# Patient Record
Sex: Female | Born: 1990 | Race: White | Hispanic: No | Marital: Single | Smoking: Current every day smoker
Health system: Southern US, Community
[De-identification: ages and names within clinical notes are randomized; demographics above are authoritative.]

## PROBLEM LIST (undated history)

## (undated) ENCOUNTER — Inpatient Hospital Stay: Payer: Self-pay

## (undated) DIAGNOSIS — Z8719 Personal history of other diseases of the digestive system: Secondary | ICD-10-CM

## (undated) HISTORY — PX: NO PAST SURGERIES: SHX2092

## (undated) HISTORY — PX: WISDOM TOOTH EXTRACTION: SHX21

---
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
## 2010-04-27 ENCOUNTER — Emergency Department: Payer: Self-pay | Admitting: Emergency Medicine

## 2011-03-22 ENCOUNTER — Ambulatory Visit: Payer: Self-pay | Admitting: Internal Medicine

## 2012-01-22 ENCOUNTER — Emergency Department: Payer: Self-pay | Admitting: Unknown Physician Specialty

## 2012-01-22 LAB — PREGNANCY, URINE: Pregnancy Test, Urine: NEGATIVE m[IU]/mL

## 2012-05-03 ENCOUNTER — Emergency Department: Payer: Self-pay | Admitting: Emergency Medicine

## 2013-03-18 ENCOUNTER — Emergency Department: Payer: Self-pay | Admitting: Emergency Medicine

## 2013-09-15 ENCOUNTER — Encounter (HOSPITAL_COMMUNITY): Payer: Self-pay | Admitting: Emergency Medicine

## 2013-09-15 ENCOUNTER — Emergency Department (HOSPITAL_COMMUNITY): Payer: Self-pay

## 2013-09-15 ENCOUNTER — Emergency Department (HOSPITAL_COMMUNITY)
Admission: EM | Admit: 2013-09-15 | Discharge: 2013-09-15 | Disposition: A | Discharge status: Home / Self Care | Payer: Self-pay | Attending: Emergency Medicine | Admitting: Emergency Medicine

## 2013-09-15 DIAGNOSIS — B9689 Other specified bacterial agents as the cause of diseases classified elsewhere: Secondary | ICD-10-CM | POA: Insufficient documentation

## 2013-09-15 DIAGNOSIS — D72829 Elevated white blood cell count, unspecified: Secondary | ICD-10-CM | POA: Insufficient documentation

## 2013-09-15 DIAGNOSIS — M545 Low back pain, unspecified: Secondary | ICD-10-CM | POA: Insufficient documentation

## 2013-09-15 DIAGNOSIS — J189 Pneumonia, unspecified organism: Secondary | ICD-10-CM

## 2013-09-15 DIAGNOSIS — J159 Unspecified bacterial pneumonia: Secondary | ICD-10-CM | POA: Insufficient documentation

## 2013-09-15 DIAGNOSIS — Z3202 Encounter for pregnancy test, result negative: Secondary | ICD-10-CM | POA: Insufficient documentation

## 2013-09-15 DIAGNOSIS — A499 Bacterial infection, unspecified: Secondary | ICD-10-CM | POA: Insufficient documentation

## 2013-09-15 DIAGNOSIS — N760 Acute vaginitis: Secondary | ICD-10-CM | POA: Insufficient documentation

## 2013-09-15 DIAGNOSIS — R112 Nausea with vomiting, unspecified: Secondary | ICD-10-CM | POA: Insufficient documentation

## 2013-09-15 LAB — COMPREHENSIVE METABOLIC PANEL
ALBUMIN: 4.1 g/dL (ref 3.5–5.2)
ALK PHOS: 86 U/L (ref 39–117)
ALT: 8 U/L (ref 0–35)
AST: 28 U/L (ref 0–37)
BUN: 8 mg/dL (ref 6–23)
CALCIUM: 9.7 mg/dL (ref 8.4–10.5)
CO2: 20 mEq/L (ref 19–32)
Chloride: 98 mEq/L (ref 96–112)
Creatinine, Ser: 0.72 mg/dL (ref 0.50–1.10)
GFR calc non Af Amer: >90 mL/min (ref >90)
Glucose, Bld: 92 mg/dL (ref 70–99)
POTASSIUM: 3.9 meq/L (ref 3.7–5.3)
SODIUM: 136 meq/L — AB (ref 137–147)
TOTAL PROTEIN: 8.1 g/dL (ref 6.0–8.3)
Total Bilirubin: 1.1 mg/dL (ref 0.3–1.2)

## 2013-09-15 LAB — URINALYSIS, ROUTINE W REFLEX MICROSCOPIC
GLUCOSE, UA: NEGATIVE mg/dL
Hgb urine dipstick: NEGATIVE
KETONES UR: 40 mg/dL — AB
Nitrite: NEGATIVE
PROTEIN: NEGATIVE mg/dL
Specific Gravity, Urine: 1.021 (ref 1.005–1.030)
Urobilinogen, UA: 1 mg/dL (ref 0.0–1.0)
pH: 6 (ref 5.0–8.0)

## 2013-09-15 LAB — CBC WITH DIFFERENTIAL/PLATELET
BASOS ABS: 0 10*3/uL (ref 0.0–0.1)
Basophils Relative: 0 % (ref 0–1)
EOS ABS: 0 10*3/uL (ref 0.0–0.7)
Eosinophils Relative: 0 % (ref 0–5)
HCT: 43.5 % (ref 36.0–46.0)
HEMOGLOBIN: 15.2 g/dL — AB (ref 12.0–15.0)
Lymphocytes Relative: 4 % — ABNORMAL LOW (ref 12–46)
Lymphs Abs: 1.6 10*3/uL (ref 0.7–4.0)
MCH: 30.2 pg (ref 26.0–34.0)
MCHC: 34.9 g/dL (ref 30.0–36.0)
MCV: 86.3 fL (ref 78.0–100.0)
Monocytes Absolute: 2.3 10*3/uL — ABNORMAL HIGH (ref 0.1–1.0)
Monocytes Relative: 6 % (ref 3–12)
NEUTROS PCT: 90 % — AB (ref 43–77)
Neutro Abs: 34.9 10*3/uL — ABNORMAL HIGH (ref 1.7–7.7)
Platelets: 237 10*3/uL (ref 150–400)
RBC: 5.04 MIL/uL (ref 3.87–5.11)
RDW: 13.2 % (ref 11.5–15.5)
WBC: 38.8 10*3/uL — ABNORMAL HIGH (ref 4.0–10.5)

## 2013-09-15 LAB — LIPASE, BLOOD: Lipase: 17 U/L (ref 11–59)

## 2013-09-15 LAB — WET PREP, GENITAL
Trich, Wet Prep: NONE SEEN
YEAST WET PREP: NONE SEEN

## 2013-09-15 LAB — URINE MICROSCOPIC-ADD ON

## 2013-09-15 LAB — PREGNANCY, URINE: PREG TEST UR: NEGATIVE

## 2013-09-15 MED ORDER — ONDANSETRON HCL 4 MG/2ML IJ SOLN
4.0000 mg | Freq: Once | INTRAMUSCULAR | Status: AC
  Administered 2013-09-15: 4 mg via INTRAVENOUS
  Filled 2013-09-15: qty 2

## 2013-09-15 MED ORDER — ONDANSETRON HCL 4 MG PO TABS: 4.0000 mg | ORAL_TABLET | Freq: Four times a day (QID) | ORAL | Status: DC

## 2013-09-15 MED ORDER — AZITHROMYCIN 250 MG PO TABS: 250.0000 mg | ORAL_TABLET | Freq: Every day | ORAL | Status: DC

## 2013-09-15 MED ORDER — DEXTROSE 5 % IV SOLN
1.0000 g | Freq: Once | INTRAVENOUS | Status: AC
  Administered 2013-09-15: 1 g via INTRAVENOUS
  Filled 2013-09-15: qty 10

## 2013-09-15 MED ORDER — IOHEXOL 300 MG/ML  SOLN
100.0000 mL | Freq: Once | INTRAMUSCULAR | Status: AC | PRN
  Administered 2013-09-15: 100 mL via INTRAVENOUS

## 2013-09-15 MED ORDER — METRONIDAZOLE 500 MG PO TABS: 500.0000 mg | ORAL_TABLET | Freq: Two times a day (BID) | ORAL | Status: DC

## 2013-09-15 MED ORDER — MORPHINE SULFATE 4 MG/ML IJ SOLN
4.0000 mg | Freq: Once | INTRAMUSCULAR | Status: AC
  Administered 2013-09-15: 4 mg via INTRAVENOUS
  Filled 2013-09-15: qty 1

## 2013-09-15 MED ORDER — SODIUM CHLORIDE 0.9 % IV BOLUS (SEPSIS)
1000.0000 mL | Freq: Once | INTRAVENOUS | Status: AC
  Administered 2013-09-15: 1000 mL via INTRAVENOUS

## 2013-09-15 NOTE — Discharge Instructions (Signed)
Please call and set-up an appointment with [facility] first thing Monday morning to be re-assessed Please rest and stay hydrated Please take antibiotics as prescribed - Azithromycin for pneumonia, Flagyl for bacterial vaginosis Please take zofran as needed for nausea Please drink plenty of fluids Please avoid foods high in fat and grease Please avoid any sexual activity until medications for BV infection have been completed Please continue to monitor symptoms and if symptoms are to worsen or change (fever greater than 101, chills, sweating, chest pain, shortness of breath, difficulty breathing, abdominal pain worsens or changes, coughing up of blood, neck pain, neck stiffness, blood in the stools, black tarry stools) please report back to the ED  Pneumonia, Adult Pneumonia is an infection of the lungs.  CAUSES Pneumonia may be caused by bacteria or a virus. Usually, these infections are caused by breathing infectious particles into the lungs (respiratory tract). SYMPTOMS   Cough.  Fever.  Chest pain.  Increased rate of breathing.  Wheezing.  Mucus production. DIAGNOSIS  If you have the common symptoms of pneumonia, your caregiver will typically confirm the diagnosis with a chest X-ray. The X-ray will show an abnormality in the lung (pulmonary infiltrate) if you have pneumonia. Other tests of your blood, urine, or sputum may be done to find the specific cause of your pneumonia. Your caregiver may also do tests (blood gases or pulse oximetry) to see how well your lungs are working. TREATMENT  Some forms of pneumonia may be spread to other people when you cough or sneeze. You may be asked to wear a mask before and during your exam. Pneumonia that is caused by bacteria is treated with antibiotic medicine. Pneumonia that is caused by the influenza virus may be treated with an antiviral medicine. Most other viral infections must run their course. These infections will not  respond to antibiotics.  PREVENTION A pneumococcal shot (vaccine) is available to prevent a common bacterial cause of pneumonia. This is usually suggested for:  People over 450 years old.  Patients on chemotherapy.  People with chronic lung problems, such as bronchitis or emphysema.  People with immune system problems. If you are over 65 or have a high risk condition, you may receive the pneumococcal vaccine if you have not received it before. In some countries, a routine influenza vaccine is also recommended. This vaccine can help prevent some cases of pneumonia.You may be offered the influenza vaccine as part of your care. If you smoke, it is time to quit. You may receive instructions on how to stop smoking. Your caregiver can provide medicines and counseling to help you quit. HOME CARE INSTRUCTIONS   Cough suppressants may be used if you are losing too much rest. However, coughing protects you by clearing your lungs. You should avoid using cough suppressants if you can.  Your caregiver may have prescribed medicine if he or she thinks your pneumonia is caused by a bacteria or influenza. Finish your medicine even if you start to feel better.  Your caregiver may also prescribe an expectorant. This loosens the mucus to be coughed up.  Only take over-the-counter or prescription medicines for pain, discomfort, or fever as directed by your caregiver.  Do not smoke. Smoking is a common cause of bronchitis and can contribute to pneumonia. If you are a smoker and continue to smoke, your cough may last several weeks after your pneumonia has cleared.  A cold steam vaporizer or humidifier in your room or home may help loosen  mucus.  Coughing is often worse at night. Sleeping in a semi-upright position in a recliner or using a couple pillows under your head will help with this.  Get rest as you feel it is needed. Your body will usually let you know when you need to rest. SEEK IMMEDIATE MEDICAL  CARE IF:   Your illness becomes worse. This is especially true if you are elderly or weakened from any other disease.  You cannot control your cough with suppressants and are losing sleep.  You begin coughing up blood.  You develop pain which is getting worse or is uncontrolled with medicines.  You have a fever.  Any of the symptoms which initially brought you in for treatment are getting worse rather than better.  You develop shortness of breath or chest pain. MAKE SURE YOU:   Understand these instructions.  Will watch your condition.  Will get help right away if you are not doing well or get worse. Document Released: 06/28/2005 Document Revised: 09/20/2011 Document Reviewed: 09/17/2010 [facility] Patient Information 2014 [place], [place].  Bacterial Vaginosis Bacterial vaginosis is a vaginal infection that occurs when the normal balance of bacteria in the vagina is disrupted. It results from an overgrowth of certain bacteria. This is the most common vaginal infection in women of childbearing age. Treatment is important to prevent complications, especially in pregnant women, as it can cause a premature delivery. CAUSES  Bacterial vaginosis is caused by an increase in harmful bacteria that are normally present in smaller amounts in the vagina. Several different kinds of bacteria can cause bacterial vaginosis. However, the reason that the condition develops is not fully understood. RISK FACTORS Certain activities or behaviors can put you at an increased risk of developing bacterial vaginosis, including:  Having a new sex partner or multiple sex partners.  Douching.  Using an intrauterine device (IUD) for contraception. Women do not get bacterial vaginosis from toilet seats, bedding, swimming pools, or contact with objects around them. SIGNS AND SYMPTOMS  Some women with bacterial vaginosis have no signs or symptoms. Common symptoms include:  Grey vaginal discharge.  A fishlike  odor with discharge, especially after sexual intercourse.  Itching or burning of the vagina and vulva.  Burning or pain with urination. DIAGNOSIS  Your health care provider will take a medical history and examine the vagina for signs of bacterial vaginosis. A sample of vaginal fluid may be taken. Your health care provider will look at this sample under a microscope to check for bacteria and abnormal cells. A vaginal pH test may also be done.  TREATMENT  Bacterial vaginosis may be treated with antibiotic medicines. These may be given in the form of a pill or a vaginal cream. A second round of antibiotics may be prescribed if the condition comes back after treatment.  HOME CARE INSTRUCTIONS   Only take over-the-counter or prescription medicines as directed by your health care provider.  If antibiotic medicine was prescribed, take it as directed. Make sure you finish it even if you start to feel better.  Do not have sex until treatment is completed.  Tell all sexual partners that you have a vaginal infection. They should see their health care provider and be treated if they have problems, such as a mild rash or itching.  Practice safe sex by using condoms and only having one sex partner. SEEK MEDICAL CARE IF:   Your symptoms are not improving after 3 days of treatment.  You have increased discharge or pain.  You  have a fever. MAKE SURE YOU:   Understand these instructions.  Will watch your condition.  Will get help right away if you are not doing well or get worse. FOR MORE INFORMATION  Centers for Disease Control and Prevention, Division of STD Prevention: SolutionApps.co.za American Sexual Health Association (ASHA): www.ashastd.org  Document Released: 06/28/2005 Document Revised: 04/18/2013 Document Reviewed: 02/07/2013 [facility] Patient Information 2014 [place], [place].   Emergency Department Resource Guide 1) Find a Doctor and Pay Out of Pocket Although you won't have to find  out who is covered by your insurance plan, it is a good idea to ask around and get recommendations. You will then need to call the office and see if the doctor you have chosen will accept you as a new patient and what types of options they offer for patients who are self-pay. Some doctors offer discounts or will set up payment plans for their patients who do not have insurance, but you will need to ask so you aren't surprised when you get to your appointment.  2) Contact Your Local Health Department Not all health departments have doctors that can see patients for sick visits, but many do, so it is worth a call to see if yours does. If you don't know where your local health department is, you can check in your phone book. The CDC also has a tool to help you locate your state's health department, and many state websites also have listings of all of their local health departments.  3) Find a Walk-in Clinic If your illness is not likely to be very severe or complicated, you may want to try a walk in clinic. These are popping up all over the country in pharmacies, drugstores, and shopping centers. They're usually staffed by nurse practitioners or physician assistants that have been trained to treat common illnesses and complaints. They're usually fairly quick and inexpensive. However, if you have serious medical issues or chronic medical problems, these are probably not your best option.  No Primary Care Doctor: - Call Health Connect at  [phone] - they can help you locate a primary care doctor that  accepts your insurance, provides certain services, etc. - Physician Referral Service- [phone]  Chronic Pain Problems: Organization         Address  Phone   Notes  [facility]  [phone] Patients need to be referred by their primary care doctor.   Medication Assistance: Organization         Address  Phone   Notes  [facility] Medication [facility] [address]  [place] [place], [place] [address] [phone] --Must be a resident of [facility] -- Must have NO insurance coverage whatsoever (no Medicaid/ Medicare, etc.) -- The pt. MUST have a primary care doctor that directs their care regularly and follows them in the community   [facility]  [phone]   [employer]  [phone]    Agencies that provide inexpensive medical care: Organization         Address  Phone   Notes  [facility]  [phone]   [facility]    [phone]   [facility] [address] [place], [place] [address] [phone]   [facility] [address] [place], [place] [phone]   Planned Parenthood    [phone]   [facility]    [phone]   [facility]  [address], [place] Phone:  [phone], Fax:  [phone] Hours of Operation:  9 am - 6 pm, M-F.  Also accepts Medicaid/Medicare and self-pay.  °[facility] ° [address], [place] Phone: [phone], Fax: [phone]. Hours of Operation:  8:30 am - 5:30 pm, M-F.  Also accepts Medicaid and self-pay.  °[facility] [place] [address], [place] Phone: ([phone]) [phone]   °[facility] [address], [place], [place] (336)[phone], Ext. 123 Mondays & Thursdays: 7-9 AM.  First 15 patients are seen on a first come, first serve basis. °  ° °Medicaid-accepting [place] Providers: ° °Organization         Address  Phone   Notes  °[facility] [address], [place] ([phone]) [phone] Also accepts self-pay patients.  °[facility] [address], [place] ° ([phone]) [phone]   °[facility] [address], [place] ([phone]) [phone]   °[facility] [address] [place] [address], [place] (336) [phone]   °Veita Bland  [address], [place]  ° ([phone]) [phone] Only accepts [place] Access Medicaid patients after they have their name applied to their card.  ° °Self-Pay (no insurance) in [place]: ° °Organization         Address  Phone   Notes  °Sickle Cell Patients, [facility] [address], [place] ([phone]) [phone]   °[facility] [address], [place] (336) [phone]   ° [facility] ° [address], [place] ([phone]) [phone]   °[facility]/Dr. Osei-Bonsu ° [address] [place] [address], [place] or [address], [place] ([phone]) [phone] Phone number for both [place] and [place] locations is the same.  °[facility] [address], [place] ([phone]) [phone]   °[facility] [address] [place] [address], [place] or [address] (336) [phone] °(336) [phone]   °[facility] [address],  (336) [phone], phone; ([phone]) [phone], fax Sees patients 1st and 3rd Saturday of every month.  Must not qualify for public or private insurance (i.e. Medicaid, Medicare, [place] Health Choice, Veterans' Benefits) • Household income should be no more than 200% of the poverty level •The clinic cannot treat you if you are pregnant or think you are pregnant • Sexually transmitted diseases are not treated at the clinic.  ° ° °Dental Care: °Organization         Address  Phone  Notes  °[place] [facility] [address],  (336) [phone] Accepts children up to age 21 who are enrolled in Medicaid or [place] Health Choice; pregnant women with a Medicaid card; and children who have applied for Medicaid or [place] Health Choice, but were declined, whose parents can pay a reduced fee at time of service.  °[place] [facility] [place]  [address], [place] (336) [phone] Accepts children up to age 21  who are enrolled in Medicaid or [place] Health Choice; pregnant women with a Medicaid card; and children who have applied for Medicaid or [place] Health Choice, but were declined, whose parents can pay a reduced fee at time of service.  °[facility] ° [address],  ([phone]) [phone]   Patients are seen by appointment only. Walk-ins are not accepted. [facility] will see patients 18 years of age and older. °Monday - Tuesday (8am-5pm) °Most Wednesdays (8:30-5pm) °$30 per visit, cash only  °[facility] ° [address], [place] ([phone]) [phone] Patients are seen by appointment only. Walk-ins are not accepted. [facility] will see patients 18 years of age and older. °One Wednesday Evening (Monthly: Volunteer Based).  $30 per visit, cash only  °[facility]  ([phone]) [phone] for adults; Children under age 4, call Graduate Pediatric Dentistry at [phone]. Children aged 4-14, please call [phone] to request a pediatric application. ° Dental services are provided in all areas of dental care including fillings, crowns and bridges, complete and partial dentures, implants, gum treatment, root canals, and extractions. Preventive care is also provided. Treatment is provided to both adults and children. °Patients are selected via a lottery and there is often a waiting list. °  °[facility] [address], °[place] ° (336) 763-8833 www.drcivils.com °  °[facility] [address], [place], [place] (336)723-1848, Ext. 123 Second and Fourth Thursday of each month, opens at 6:30 AM; Clinic ends at 9 AM.  Patients are seen on a first-come first-served basis, and a limited number are seen during each clinic.  ° °[facility] ° [address], [place], [place] (336) [phone]   Eligibility Requirements °You must have lived in [place], [place], or [place] counties for at least the last three months. °   You cannot be eligible for state or federal sponsored healthcare insurance, including Veterans Administration, Medicaid, or Medicare. °  You generally cannot be eligible for healthcare insurance through your employer.  °  How to apply: °Eligibility screenings are held every Tuesday and Wednesday afternoon from 1:00 pm until 4:00 pm. You do not need an appointment for the interview!  °[facility] [address], [place], [place] 336-631-2330   °[place] [facility]  336-342-8273   °[facility]  336-703-3100   °[facility]  336-570-6415   ° °Behavioral Health Resources in the Community: °Intensive Outpatient Programs °Organization         Address  Phone  Notes  °[facility] [address], [place], [place] 336-878-6098   °[facility] [address], [place], [place] 336-832-9800   °[facility] [address], [place], [place] ° 336-882-2125   °[facility] [address],  °[place], [place] 1-800-853-5163 or 336-641-4981   °Substance Abuse Resources °Organization         Address  Phone  Notes  °[facility]  336-882-2125   °[facility]  336-784-9470   °[facility]  336-285-9073   °[facility]  336-845-3988   °Residential & Outpatient Substance Abuse Program  1-800-659-3381   °Psychological Services °Organization         Address  Phone  Notes  ° Health  336- 832-9600   °[facility]  336- 378-7881   °[facility] [address], [place] 1-800-853-5163 or 336-641-4981   ° °Mobile Crisis Teams °Organization         Address  Phone  Notes  °[facility], Mobile Crisis Care Unit  1-877-626-1772   °Assertive °[facility] ° [address] [place], [place] 336-834-9664   °Sharon DeEsch [address] °  [place] 336-554-5454   ° °Self-Help/Support  Groups °Organization         Address  Phone             Notes  °[facility] [place] - variety of support groups  336- 373-1402 Call for more information  °Narcotics Anonymous (NA), [facility] [address], °[place]  2 meetings at this location  ° °Residential Treatment Programs °Organization         Address  Phone  Notes  °[facility] [address],    °[place]  1-866-801-8205   °[facility] ° [address], [place], [place] 704-293-8524   °[facility] [address], [place] 336-845-3988 Admissions: 8am-3pm M-F  °[facility] [address]    °[place], [place] 336-841-1104   °[facility] [address], [place], [place] 336-379-7146   °[facility] [address]  °[place], [place] 336-285-9073   °[facility] - Intensive Outpatient [address], [place], [place] 336-852-3033   °[facility] [address]  °[place], [place] 1-877-615-2722 or 336-784-9470   °[facility] ([facility]) [address] [place], [place] 336-227-7417 Accepts Medicaid  °[facility] [address]  °[place] 1-800-659-3381 Substance Abuse/Addiction Treatment  ° °[place] Behavioral Health Resources °Organization         Address  Phone  Notes  °[facility]  (888) 581-9988   °Julie Brannon, PhD [address] [place], [place]   (336) 349-5553 or (336) 951-0000   °[facility]   [address] °[place], [place] (336) 349-4454   °[facility] [address], [place], [place] (336) 342-8316 Insurance/Medicaid/sponsorship through [facility]  °[facility] [address]                                    [place], [place] (336) 342-8316 Therapy/tele-psych/case  °[facility] [address]  ° [place], [place] (336) 349-2233    °Dr. Arfeen  (336) 349-4544   °[facility] [place]  [employer] [place] [facility] 1) [address], [place] °2) [address], [place] °3)  [address], [place] (336) 349-3220 °(336) 342-7768 ° °(336) 342-8140   °[place] Child Abuse [facility] (336) 342-1394 or (336) 342-3537 (After Hours)    ° ° ° °

## 2013-09-15 NOTE — ED Provider Notes (Signed)
Medical screening examination/treatment/procedure(s) were conducted as a shared visit with non-physician practitioner(s) and myself.  I personally evaluated the patient during the encounter.  Patient with normal VS, main complaint of nausea and vomiting, vague and diffuse back pain. Abdominal, back, pelvic exams are benign. However, patient found to have WBC of 38,000.  I have discussed my concerns for occult infection - possibly retroperitoneal process v. Atypical appendicits or ruptured appendicitis, with the patient and we will obtain a contrasted CT scan of the a/p for further evaluation.   I have counseled the patient on the importance of follow up this coming [facility] for further evaluation of leukocytosis. She voices understanding. The patient will be signed out to Dr. Fredderick PhenixBelfi at change of shift and she will follow up on the CT scan and disposition appropriately.   Brandt LoosenJulie Leighanne Adolph, MD 09/15/13 [phone]

## 2013-09-15 NOTE — ED Notes (Signed)
Per EMS, Pt coming from home c/o N/V, productive cough and chills x 3 days. A&Ox4.

## 2013-09-15 NOTE — ED Notes (Signed)
Family at bedside. 

## 2013-09-15 NOTE — ED Provider Notes (Signed)
Discussed case with Jaynie Crumble, PA-C. Transfer of care to this provider from [facility], PA-C at change in shift.   Lauren Carrillo is a 23 y/o F with no known significant PMHx presenting to the ED with nausea, vomiting, productive cough, chills for 3 days.   Results for orders placed during the hospital encounter of 09/15/13  WET PREP, GENITAL      Result Value Ref Range   Yeast Wet Prep HPF POC NONE SEEN  NONE SEEN   Trich, Wet Prep NONE SEEN  NONE SEEN   Clue Cells Wet Prep HPF POC FEW (*) NONE SEEN   WBC, Wet Prep HPF POC FEW (*) NONE SEEN  URINALYSIS, ROUTINE W REFLEX MICROSCOPIC      Result Value Ref Range   Color, Urine AMBER (*) YELLOW   APPearance CLOUDY (*) CLEAR   Specific Gravity, Urine 1.021  1.005 - 1.030   pH 6.0  5.0 - 8.0   Glucose, UA NEGATIVE  NEGATIVE mg/dL   Hgb urine dipstick NEGATIVE  NEGATIVE   Bilirubin Urine SMALL (*) NEGATIVE   Ketones, ur 40 (*) NEGATIVE mg/dL   Protein, ur NEGATIVE  NEGATIVE mg/dL   Urobilinogen, UA 1.0  0.0 - 1.0 mg/dL   Nitrite NEGATIVE  NEGATIVE   Leukocytes, UA TRACE (*) NEGATIVE  CBC WITH DIFFERENTIAL      Result Value Ref Range   WBC 38.8 (*) 4.0 - 10.5 K/uL   RBC 5.04  3.87 - 5.11 MIL/uL   Hemoglobin 15.2 (*) 12.0 - 15.0 g/dL   HCT 16.1  09.6 - 04.5 %   MCV 86.3  78.0 - 100.0 fL   MCH 30.2  26.0 - 34.0 pg   MCHC 34.9  30.0 - 36.0 g/dL   RDW 40.9  81.1 - 91.4 %   Platelets 237  150 - 400 K/uL   Neutrophils Relative % 90 (*) 43 - 77 %   Lymphocytes Relative 4 (*) 12 - 46 %   Monocytes Relative 6  3 - 12 %   Eosinophils Relative 0  0 - 5 %   Basophils Relative 0  0 - 1 %   Neutro Abs 34.9 (*) 1.7 - 7.7 K/uL   Lymphs Abs 1.6  0.7 - 4.0 K/uL   Monocytes Absolute 2.3 (*) 0.1 - 1.0 K/uL   Eosinophils Absolute 0.0  0.0 - 0.7 K/uL   Basophils Absolute 0.0  0.0 - 0.1 K/uL   WBC Morphology TOXIC GRANULATION    COMPREHENSIVE METABOLIC PANEL      Result Value Ref Range   Sodium 136 (*) 137 - 147 mEq/L    Potassium 3.9  3.7 - 5.3 mEq/L   Chloride 98  96 - 112 mEq/L   CO2 20  19 - 32 mEq/L   Glucose, Bld 92  70 - 99 mg/dL   BUN 8  6 - 23 mg/dL   Creatinine, Ser 7.82  0.50 - 1.10 mg/dL   Calcium 9.7  8.4 - 95.6 mg/dL   Total Protein 8.1  6.0 - 8.3 g/dL   Albumin 4.1  3.5 - 5.2 g/dL   AST 28  0 - 37 U/L   ALT 8  0 - 35 U/L   Alkaline Phosphatase 86  39 - 117 U/L   Total Bilirubin 1.1  0.3 - 1.2 mg/dL   GFR calc non Af Amer >90  >90 mL/min   GFR calc Af Amer >90  >90 mL/min  LIPASE, BLOOD  Result Value Ref Range   Lipase 17  11 - 59 U/L  URINE MICROSCOPIC-ADD ON      Result Value Ref Range   Squamous Epithelial / LPF MANY (*) RARE   WBC, UA 0-2  <3 WBC/hpf   Bacteria, UA RARE  RARE   Urine-Other MUCOUS PRESENT    PREGNANCY, URINE      Result Value Ref Range   Preg Test, Ur NEGATIVE  NEGATIVE   Dg Chest 2 View  09/15/2013   CLINICAL DATA:  Nausea, emesis, abdominal pain.  EXAM: CHEST  2 VIEW  COMPARISON:  None.  FINDINGS: Bronchitic changes without consolidation, edema, effusion, or pneumothorax. Normal heart size. Negative osseous structures.  IMPRESSION: Bronchitic changes, which could be inflammatory or infectious. No consolidating pneumonia.   Electronically Signed   By: Tiburcio Pea M.D.   On: 09/15/2013 05:25   Ct Abdomen Pelvis W Contrast  09/15/2013   CLINICAL DATA:  Elevated white count. Nausea, vomiting and productive cough.  EXAM: CT ABDOMEN AND PELVIS WITH CONTRAST  TECHNIQUE: Multidetector CT imaging of the abdomen and pelvis was performed using the standard protocol following bolus administration of intravenous contrast.  CONTRAST:  OMNIPAQUE IOHEXOL 300 MG/ML  SOLN  COMPARISON:  None.  FINDINGS: There are subtle patchy parenchymal lung densities bilaterally. Findings are concerning for atypical pneumonia. No evidence for pleural effusions. No evidence for free intraperitoneal air.  Subtle density in the left hepatic lobe on image 23, sequence 2 is nonspecific.  Finding could be related to a small hypervascular structure and likely an incidental finding. There is also a subtle 1.1 cm low-density structure in the right hepatic lobe adjacent to the gallbladder. This could represent a small cyst or hemangioma. Normal appearance of the gallbladder and portal venous system. Normal appearance of the spleen, pancreas, adrenal glands and kidneys. Incidentally, the patient has a retro aortic left renal vein.  No gross abnormality to the uterus or adnexal structures. Small enhancing or hyperdense structure in the right adnexa probably represents a corpus luteum cyst. No significant free fluid or lymphadenopathy. Appendix is located in the right upper quadrant of the abdomen without inflammatory changes.  No acute bone abnormality.  IMPRESSION: Patchy subtle lung densities bilaterally. Findings are suggestive for atypical infection.  No acute abnormality within the abdomen or pelvis.  Probable incidental findings within the liver as described.   Electronically Signed   By: Richarda Overlie M.D.   On: 09/15/2013 07:08   7:52 AM This provider reviewed the labs and imaging with Dr. Lavella Lemons who agreed to discharge of the patient on antibiotics.   8:29 AM This provider re-assessed the patient. Patient found sitting upright in bed, comfortable. Negative use of accessory muscles, negative stridor. Lungs clear to auscultation to upper and lower lobes bilaterally. Heart rate and rhythm normal. Radial and DP pulses 2+ bilaterally. Cap refill less than 3 seconds. Oral exam unremarkable. Abdomen negative distention or deformities noted. Bowel sounds normoactive in all 4 quadrants. Soft upon palpation with negative pain-benign abdominal exam. Full range of motion to upper and lower extremities bilaterally without difficulty or ataxia noted. Strength intact to equal grip strengths. Vitals rechecked. 98.48F, heart rate 62 beats per minute, respiratory rate 16 per minute, blood pressure 107/53-patient  has been sleeping and laying down, pulse ox 97% on room air. Negative signs of hypoxia. Negative signs of sepsis. This provider reviewed patient's labs and imaging in great detail. Discussed with patient small hepatic identified in findings of pneumonia.  Discussed leukocytosis and stressed importance of following up as outpatient. Patient to be fluid challenge. Rocephin IV to be administered for possible pneumonia.  9:17 AM Patient passed fluid challenge. Rocephin being administered now.   CBC noted elevation white blood cell count of 30.8 with elevated neutrophil count of 90. Hemoglobin 15.2, hematocrit 43.5. CMP noted mildly low sodium of 136-negative acute findings. Lipase negative elevation. Urinalysis noted small bilirubin with trace of leukocytes-negative pyuria or bacteria identified in the urine-negative signs of infection. Urine pregnancy negative. Wet prep noted few clue cells and a few white blood cells-GC Chlamydia probe pending. Chest x-ray noted from chronic changes to be inflammatory infectious-negative consolidation identified. CT abdomen pelvis identified patchy subtle lung densities bilaterally suggestive atypical infection-no acute abnormality within the pelvis or abdomen.  Medications  sodium chloride 0.9 % bolus 1,000 mL (0 mLs Intravenous Stopped 09/15/13 0619)  ondansetron (ZOFRAN) injection 4 mg (4 mg Intravenous Given 09/15/13 0420)  morphine 4 MG/ML injection 4 mg (4 mg Intravenous Given 09/15/13 0420)  iohexol (OMNIPAQUE) 300 MG/ML solution 100 mL (100 mLs Intravenous Contrast Given 09/15/13 0634)  cefTRIAXone (ROCEPHIN) 1 g in dextrose 5 % 50 mL IVPB (0 g Intravenous Stopped 09/15/13 1001)   Filed Vitals:   09/15/13 0220 09/15/13 0756 09/15/13 1001  BP: 118/55 107/53 115/56  Pulse: 94 62 55  Temp: 98.7 F (37.1 C) 98.5 F (36.9 C)   TempSrc: Oral Oral   Resp: 18 16 16   SpO2: 96% 97% 97%   Diagnoses that have been ruled out:  None  Diagnoses that are still under  consideration:  None  Final diagnoses:  Community acquired pneumonia  BV (bacterial vaginosis)  Leukocytosis    Doubt pyelonephritis. Doubt UTI. Doubt kidney stones. Doubt pancreatitis. Negative findings of appendicitis. PERC score low - doubt PE. Negative acute abdominal processes noted on CT. Patient presenting to the ED with findings of pneumonia on CT abdomen and pelvis. Few clue cells noted on wet prep. Patient has elevated WBC of 38.8 with unknown source of elevated WBC count - unknown etiology. Patient given morphine, antiemetics and fluids via IV. Patient doing well and pain free. Patient re-assessed with negative findings on exam - patient appears well and is doing well. Rocephin administered in ED setting. Patient responded well. Patient negative for fever, hypotensions, tachycardia, or tachypnea. Patient not septic. Patient tolerated fluid challenge with negative episodes of emesis while in ED setting. Patient stable, afebrile. Discharged patient. Discharged patient with z-pack for pneumonia, flagyl for BV, and zofran. [facility] - discussed with patient that she needs to follow-up stressed importance. Discussed with patient to rest and stay hydrated. Discussed with patient  proper diet. Discussed with patient to closely monitor symptoms and if symptoms are to worsen or change to report back to the ED - strict return instructions given.  Patient agreed to plan of care, understood, all questions answered.    Raymon MuttonMarissa Sandara Tyree, PA-C 09/15/13 1709

## 2013-09-15 NOTE — ED Notes (Signed)
Bed: [id number] Expected date:  Expected time:  Means of arrival:  Comments: 5M Flu

## 2013-09-15 NOTE — ED Provider Notes (Signed)
CSN: [id number]     Arrival date & time 09/15/13  0213 History   First MD Initiated Contact with Patient 09/15/13 0319     Chief Complaint  Patient presents with  . Nausea  . Emesis  . Abdominal Pain     (Consider location/radiation/quality/duration/timing/severity/associated sxs/prior Treatment) HPI Lauren Carrillo is a 23 y.o. female who presents to emergency department complaining of nausea, vomiting, abdominal pain, back pain, cough, chills for 3 days. Patient states she's unable to keep any fluids or solids down. She has tried Tylenol for his symptoms with no relief. She denies any known fever at home. She states her back pain is across her lower back, radiates into bilateral thighs, and has sharp shooting pains on the inside of her thighs. She states this pain is worsened with movement. She's also complaining of diffuse crampy abdominal pain onset at the same time. She states her cough is productive, with thick sputum. She denies any chest pain or shortness of breath. She denies any recent travel or surgeries. She denies any urinary or vaginal symptoms. She denies any headache, neck pain or stiffness. She is otherwise healthy and does not take any medications. She's not a smoker.  History reviewed. No pertinent past medical history. History reviewed. No pertinent past surgical history. No family history on file. History  Substance Use Topics  . Smoking status: Never Smoker   . Smokeless tobacco: Never Used  . Alcohol Use: Yes   OB History   Grav Para Term Preterm Abortions TAB SAB Ect Mult Living                 Review of Systems  Constitutional: Positive for chills. Negative for fever.  HENT: Positive for congestion. Negative for sore throat.   Respiratory: Positive for cough. Negative for chest tightness and shortness of breath.   Cardiovascular: Negative for chest pain, palpitations and leg swelling.  Gastrointestinal: Positive for nausea, vomiting and abdominal pain.  Negative for diarrhea and blood in stool.  Genitourinary: Negative for dysuria, frequency, hematuria, flank pain, vaginal bleeding, vaginal discharge, vaginal pain and pelvic pain.  Musculoskeletal: Positive for back pain. Negative for arthralgias, myalgias, neck pain and neck stiffness.  Skin: Negative for rash.  Neurological: Negative for dizziness, weakness and headaches.  All other systems reviewed and are negative.      Allergies  Review of patient's allergies indicates no known allergies.  Home Medications   Current Outpatient Rx  Name  Route  Sig  Dispense  Refill  . acetaminophen (TYLENOL) 500 MG tablet   Oral   Take 500 mg by mouth every 6 (six) hours as needed.          BP 118/55  Pulse 94  Temp(Src) 98.7 F (37.1 C) (Oral)  Resp 18  SpO2 96%  LMP 08/24/2013 Physical Exam  Nursing note and vitals reviewed. Constitutional: She is oriented to person, place, and time. She appears well-developed and well-nourished. No distress.  HENT:  Head: Normocephalic.  Eyes: Conjunctivae are normal.  Neck: Neck supple.  Cardiovascular: Normal rate, regular rhythm and normal heart sounds.   Pulmonary/Chest: Effort normal and breath sounds normal. No respiratory distress. She has no wheezes. She has no rales. She exhibits no tenderness.  Abdominal: Soft. Bowel sounds are normal. She exhibits no distension. There is tenderness. There is no rebound and no guarding.  Diffuse mild tenderness.  Genitourinary:  Normal external genitalia. Normal vaginal canal with minimal white thin discharge. No CMT, no adnexal or uterine  tenderness.   Musculoskeletal: She exhibits no edema.  Neurological: She is alert and oriented to person, place, and time. No cranial nerve deficit.  Skin: Skin is warm and dry.  Psychiatric: She has a normal mood and affect. Her behavior is normal.    ED Course  Procedures (including critical care time) Labs Review Labs Reviewed  URINALYSIS, ROUTINE W  REFLEX MICROSCOPIC - Abnormal; Notable for the following:    Color, Urine AMBER (*)    APPearance CLOUDY (*)    Bilirubin Urine SMALL (*)    Ketones, ur 40 (*)    Leukocytes, UA TRACE (*)    All other components within normal limits  CBC WITH DIFFERENTIAL - Abnormal; Notable for the following:    WBC 38.8 (*)    Hemoglobin 15.2 (*)    Neutrophils Relative % 90 (*)    Lymphocytes Relative 4 (*)    Neutro Abs 34.9 (*)    Monocytes Absolute 2.3 (*)    All other components within normal limits  COMPREHENSIVE METABOLIC PANEL - Abnormal; Notable for the following:    Sodium 136 (*)    All other components within normal limits  URINE MICROSCOPIC-ADD ON - Abnormal; Notable for the following:    Squamous Epithelial / LPF MANY (*)    All other components within normal limits  LIPASE, BLOOD  POC URINE PREG, ED   Imaging Review Dg Chest 2 View  09/15/2013   CLINICAL DATA:  Nausea, emesis, abdominal pain.  EXAM: CHEST  2 VIEW  COMPARISON:  None.  FINDINGS: Bronchitic changes without consolidation, edema, effusion, or pneumothorax. Normal heart size. Negative osseous structures.  IMPRESSION: Bronchitic changes, which could be inflammatory or infectious. No consolidating pneumonia.   Electronically Signed   By: Tiburcio Pea M.D.   On: 09/15/2013 05:25     EKG Interpretation None      MDM   Final diagnoses:  None     patient's with nausea, vomiting, abdominal pain, back pain, cough for 3 days. Will check labs, chest x-ray, fluids started, Zofran and morphine ordered.  6:19 AM Patient is feeling much better after IV fluids, Zofran, morphine. Her abdomen reassessed and is soft with no tenderness. Pelvic exam performed and was unremarkable. Discussed with Dr. Arnoldo Morale given her very high white blood cell count. I will do CT scan of her abdomen and pelvis with IV contrast to rule out any retroperitoneal infectious processes.  Filed Vitals:   09/15/13 0220  BP: 118/55  Pulse: 94  Temp:  98.7 F (37.1 C)  TempSrc: Oral  Resp: 18  SpO2: 96%    Signed out with PA Sciacca at shift change. Pending CT  And wet prep.      Lottie Mussel, PA-C 09/15/13 4098  Lottie Mussel, PA-C 09/15/13 [phone]

## 2013-09-16 LAB — GC/CHLAMYDIA PROBE AMP
CT Probe RNA: NEGATIVE
GC PROBE AMP APTIMA: NEGATIVE

## 2013-09-16 NOTE — ED Provider Notes (Signed)
Medical screening examination/treatment/procedure(s) were performed by non-physician practitioner and as supervising physician I was immediately available for consultation/collaboration.   EKG Interpretation None        Brandt LoosenJulie Manly, MD 09/16/13 [phone]

## 2013-09-17 LAB — POC URINE PREG, ED: PREG TEST UR: NEGATIVE

## 2014-09-04 ENCOUNTER — Emergency Department (HOSPITAL_COMMUNITY)
Admission: EM | Admit: 2014-09-04 | Discharge: 2014-09-04 | Disposition: A | Discharge status: Home / Self Care | Payer: Self-pay | Attending: Emergency Medicine | Admitting: Emergency Medicine

## 2014-09-04 ENCOUNTER — Encounter (HOSPITAL_COMMUNITY): Payer: Self-pay | Admitting: *Deleted

## 2014-09-04 DIAGNOSIS — R1084 Generalized abdominal pain: Secondary | ICD-10-CM | POA: Insufficient documentation

## 2014-09-04 DIAGNOSIS — R112 Nausea with vomiting, unspecified: Secondary | ICD-10-CM | POA: Insufficient documentation

## 2014-09-04 DIAGNOSIS — Z792 Long term (current) use of antibiotics: Secondary | ICD-10-CM | POA: Insufficient documentation

## 2014-09-04 DIAGNOSIS — Z79899 Other long term (current) drug therapy: Secondary | ICD-10-CM | POA: Insufficient documentation

## 2014-09-04 DIAGNOSIS — R197 Diarrhea, unspecified: Secondary | ICD-10-CM | POA: Insufficient documentation

## 2014-09-04 DIAGNOSIS — Z3202 Encounter for pregnancy test, result negative: Secondary | ICD-10-CM | POA: Insufficient documentation

## 2014-09-04 DIAGNOSIS — Z720 Tobacco use: Secondary | ICD-10-CM | POA: Insufficient documentation

## 2014-09-04 LAB — URINALYSIS, ROUTINE W REFLEX MICROSCOPIC
Glucose, UA: NEGATIVE mg/dL
HGB URINE DIPSTICK: NEGATIVE
Ketones, ur: 15 mg/dL — AB
Leukocytes, UA: NEGATIVE
NITRITE: NEGATIVE
Protein, ur: NEGATIVE mg/dL
SPECIFIC GRAVITY, URINE: 1.038 — AB (ref 1.005–1.030)
UROBILINOGEN UA: 0.2 mg/dL (ref 0.0–1.0)
pH: 5.5 (ref 5.0–8.0)

## 2014-09-04 LAB — POC URINE PREG, ED: Preg Test, Ur: NEGATIVE

## 2014-09-04 MED ORDER — ONDANSETRON HCL 4 MG/2ML IJ SOLN
4.0000 mg | Freq: Once | INTRAMUSCULAR | Status: AC
  Administered 2014-09-04: 4 mg via INTRAVENOUS
  Filled 2014-09-04: qty 2

## 2014-09-04 MED ORDER — ONDANSETRON 4 MG PO TBDP
8.0000 mg | ORAL_TABLET | Freq: Once | ORAL | Status: DC
  Filled 2014-09-04: qty 2

## 2014-09-04 MED ORDER — SODIUM CHLORIDE 0.9 % IV BOLUS (SEPSIS)
1000.0000 mL | Freq: Once | INTRAVENOUS | Status: AC
  Administered 2014-09-04: 1000 mL via INTRAVENOUS

## 2014-09-04 MED ORDER — MORPHINE SULFATE 4 MG/ML IJ SOLN
4.0000 mg | Freq: Once | INTRAMUSCULAR | Status: AC
  Administered 2014-09-04: 4 mg via INTRAVENOUS
  Filled 2014-09-04: qty 1

## 2014-09-04 MED ORDER — ONDANSETRON 4 MG PO TBDP: 4.0000 mg | ORAL_TABLET | Freq: Three times a day (TID) | ORAL | Status: DC | PRN

## 2014-09-04 NOTE — ED Notes (Signed)
Patient states she ate some hibachi this evening and started to vomit

## 2014-09-04 NOTE — ED Provider Notes (Signed)
CSN: [id number]     Arrival date & time 09/04/14  0122 History   First MD Initiated Contact with Patient 09/04/14 0257     Chief Complaint  Patient presents with  . Abdominal Pain     (Consider location/radiation/quality/duration/timing/severity/associated sxs/prior Treatment) HPI Comments: Patient is a 24 yo F presenting to the ED for acute onset nausea, vomiting, diarrhea with generalized abdominal cramping pain that began after eating Hibachi earlier in the day. Patient states she has had 3 episodes of non-bloody non-bilious emesis and 3 episodes of non-bloody diarrhea. She states that the abdominal pain is worse with vomiting and diarrhea. No medications tried PTA. No modifying factors identified. Denies any CP, SOB, urinary symptoms, vaginal symptoms. LMP was 08/15/14. No abdominal surgical history.   Patient is a 24 y.o. female presenting with abdominal pain.  Abdominal Pain Associated symptoms: diarrhea, nausea and vomiting     History reviewed. No pertinent past medical history. History reviewed. No pertinent past surgical history. No family history on file. History  Substance Use Topics  . Smoking status: Current Every Day Smoker  . Smokeless tobacco: Never Used  . Alcohol Use: Yes   OB History    No data available     Review of Systems  Gastrointestinal: Positive for nausea, vomiting, abdominal pain and diarrhea.  All other systems reviewed and are negative.     Allergies  Review of patient's allergies indicates no known allergies.  Home Medications   Prior to Admission medications   Medication Sig Start Date End Date Taking? Authorizing Provider  acetaminophen (TYLENOL) 500 MG tablet Take 500-1,000 mg by mouth every 6 (six) hours as needed for mild pain.    Yes Historical Provider, MD  azithromycin (ZITHROMAX) 250 MG tablet Take 1 tablet (250 mg total) by mouth daily. Take first 2 tablets together, then 1 every day until finished. Patient not taking: Reported on  09/04/2014 09/15/13   Marissa Sciacca, PA-C  Chlorpheniramine Maleate (ALLERGY PO) Take 2 tablets by mouth daily as needed (allergies).   Yes Historical Provider, MD  metroNIDAZOLE (FLAGYL) 500 MG tablet Take 1 tablet (500 mg total) by mouth 2 (two) times daily. Patient not taking: Reported on 09/04/2014 09/15/13   Marissa Sciacca, PA-C  ondansetron (ZOFRAN ODT) 4 MG disintegrating tablet Take 1 tablet (4 mg total) by mouth every 8 (eight) hours as needed for nausea or vomiting. 09/04/14   Lise AuerJennifer L Ulysee Fyock, PA-C  ondansetron (ZOFRAN) 4 MG tablet Take 1 tablet (4 mg total) by mouth every 6 (six) hours. Patient not taking: Reported on 09/04/2014 09/15/13   Marissa Sciacca, PA-C   BP 99/45 mmHg  Pulse 59  Temp(Src) 97.8 F (36.6 C) (Oral)  Resp 19  Ht 5\' 2"  (1.575 m)  Wt 140 lb (63.504 kg)  BMI 25.60 kg/m2  SpO2 97%  LMP 08/15/2014 Physical Exam  Constitutional: She is oriented to person, place, and time. She appears well-developed and well-nourished. No distress.  HENT:  Head: Normocephalic and atraumatic.  Right Ear: External ear normal.  Left Ear: External ear normal.  Nose: Nose normal.  Mouth/Throat: Oropharynx is clear and moist. No oropharyngeal exudate.  Eyes: Conjunctivae are normal.  Neck: Neck supple.  Cardiovascular: Normal rate, regular rhythm and normal heart sounds.   Pulmonary/Chest: Effort normal and breath sounds normal. No respiratory distress.  Abdominal: Soft. Bowel sounds are normal. She exhibits no distension. There is tenderness (generalized). There is no rebound and no guarding.  Musculoskeletal: Normal range of motion. She exhibits no  edema.  Neurological: She is alert and oriented to person, place, and time.  Skin: Skin is warm and dry. She is not diaphoretic.  Nursing note and vitals reviewed.   ED Course  Procedures (including critical care time) Medications  ondansetron (ZOFRAN-ODT) disintegrating tablet 8 mg (0 mg Oral Hold 09/04/14 0257)  sodium  chloride 0.9 % bolus 1,000 mL (0 mLs Intravenous Stopped 09/04/14 0406)  morphine 4 MG/ML injection 4 mg (4 mg Intravenous Given 09/04/14 0312)  ondansetron (ZOFRAN) injection 4 mg (4 mg Intravenous Given 09/04/14 0313)    Labs Review Labs Reviewed  URINALYSIS, ROUTINE W REFLEX MICROSCOPIC - Abnormal; Notable for the following:    Color, Urine AMBER (*)    APPearance CLOUDY (*)    Specific Gravity, Urine 1.038 (*)    Bilirubin Urine SMALL (*)    Ketones, ur 15 (*)    All other components within normal limits  POC URINE PREG, ED    Imaging Review No results found.   EKG Interpretation None      MDM   Final diagnoses:  Nausea vomiting and diarrhea    Filed Vitals:   09/04/14 0548  BP: 99/45  Pulse: 59  Temp: 97.8 F (36.6 C)  Resp:    Afebrile, NAD, non-toxic appearing, AAOx4.    Vitals are stable, no fever.  No signs of dehydration, tolerating PO fluids > 6 oz.  Lungs are clear.  No focal abdominal pain, no concern for appendicitis, cholecystitis, pancreatitis, ruptured viscus, UTI, kidney stone, or any other abdominal etiology.  Supportive therapy indicated with return if symptoms worsen.  Patient counseled. Return precautions discussed. Patient is agreeable to plan. Patient is stable at time of discharge      Jeannetta Ellis, PA-C 09/04/14 1914  Derwood Kaplan, MD 09/04/14 [phone]

## 2014-09-04 NOTE — ED Notes (Signed)
Pt was given sprite

## 2014-09-04 NOTE — ED Notes (Signed)
Spoke to [place]Jen, [place]-C. Awaiting orders for fluid administration.

## 2014-09-04 NOTE — Discharge Instructions (Signed)
Please follow up with your primary care physician in 1-2 days. If you do not have one please call the [facility] number listed above. Please read all discharge instructions and return precautions.  ° °Viral Gastroenteritis °Viral gastroenteritis is also known as stomach flu. This condition affects the stomach and intestinal tract. It can cause sudden diarrhea and vomiting. The illness typically lasts 3 to 8 days. Most people develop an immune response that eventually gets rid of the virus. While this natural response develops, the virus can make you quite ill. °CAUSES  °Many different viruses can cause gastroenteritis, such as rotavirus or noroviruses. You can catch one of these viruses by consuming contaminated food or water. You may also catch a virus by sharing utensils or other personal items with an infected person or by touching a contaminated surface. °SYMPTOMS  °The most common symptoms are diarrhea and vomiting. These problems can cause a severe loss of body fluids (dehydration) and a body salt (electrolyte) imbalance. Other symptoms may include: °· Fever. °· Headache. °· Fatigue. °· Abdominal pain. °DIAGNOSIS  °Your caregiver can usually diagnose viral gastroenteritis based on your symptoms and a physical exam. A stool sample may also be taken to test for the presence of viruses or other infections. °TREATMENT  °This illness typically goes away on its own. Treatments are aimed at rehydration. The most serious cases of viral gastroenteritis involve vomiting so severely that you are not able to keep fluids down. In these cases, fluids must be given through an intravenous line (IV). °HOME CARE INSTRUCTIONS  °· Drink enough fluids to keep your urine clear or pale yellow. Drink small amounts of fluids frequently and increase the amounts as tolerated. °· Ask your caregiver for specific rehydration instructions. °· Avoid: °¨ Foods high in sugar. °¨ Alcohol. °¨ Carbonated  drinks. °¨ Tobacco. °¨ Juice. °¨ Caffeine drinks. °¨ Extremely hot or cold fluids. °¨ Fatty, greasy foods. °¨ Too much intake of anything at one time. °¨ Dairy products until 24 to 48 hours after diarrhea stops. °· You may consume probiotics. Probiotics are active cultures of beneficial bacteria. They may lessen the amount and number of diarrheal stools in adults. Probiotics can be found in yogurt with active cultures and in supplements. °· Wash your hands well to avoid spreading the virus. °· Only take over-the-counter or prescription medicines for pain, discomfort, or fever as directed by your caregiver. Do not give aspirin to children. Antidiarrheal medicines are not recommended. °· Ask your caregiver if you should continue to take your regular prescribed and over-the-counter medicines. °· Keep all follow-up appointments as directed by your caregiver. °SEEK IMMEDIATE MEDICAL CARE IF:  °· You are unable to keep fluids down. °· You do not urinate at least once every 6 to 8 hours. °· You develop shortness of breath. °· You notice blood in your stool or vomit. This may look like coffee grounds. °· You have abdominal pain that increases or is concentrated in one small area (localized). °· You have persistent vomiting or diarrhea. °· You have a fever. °· The patient is a child younger than 3 months, and he or she has a fever. °· The patient is a child older than 3 months, and he or she has a fever and persistent symptoms. °· The patient is a child older than 3 months, and he or she has a fever and symptoms suddenly get worse. °· The patient is a baby, and he or she has no tears when   crying. °MAKE SURE YOU:  °· Understand these instructions. °· Will watch your condition. °· Will get help right away if you are not doing well or get worse. °Document Released: 06/28/2005 Document Revised: 09/20/2011 Document Reviewed: 04/14/2011 °[employer]® Patient Information ©2015 [employer]. This information is not intended to replace  advice given to you by your health care provider. Make sure you discuss any questions you have with your health care provider. ° °

## 2014-12-26 ENCOUNTER — Encounter (HOSPITAL_COMMUNITY): Payer: Self-pay | Admitting: Emergency Medicine

## 2014-12-26 DIAGNOSIS — R51 Headache: Secondary | ICD-10-CM | POA: Insufficient documentation

## 2014-12-26 DIAGNOSIS — Z720 Tobacco use: Secondary | ICD-10-CM | POA: Insufficient documentation

## 2014-12-26 NOTE — ED Notes (Signed)
The patient said she has been haivng a headache since tuesday.  She has taken several things for the headache and nothing has worked.  She rates her pain 10/10.  She is nauseated.

## 2014-12-27 ENCOUNTER — Emergency Department (HOSPITAL_COMMUNITY)
Admission: EM | Admit: 2014-12-27 | Discharge: 2014-12-27 | Disposition: A | Discharge status: Home / Self Care | Payer: Self-pay | Attending: Emergency Medicine | Admitting: Emergency Medicine

## 2014-12-27 DIAGNOSIS — R519 Headache, unspecified: Secondary | ICD-10-CM

## 2014-12-27 DIAGNOSIS — R51 Headache: Secondary | ICD-10-CM

## 2014-12-27 MED ORDER — DIPHENHYDRAMINE HCL 25 MG PO CAPS
50.0000 mg | ORAL_CAPSULE | Freq: Once | ORAL | Status: AC
  Administered 2014-12-27: 50 mg via ORAL
  Filled 2014-12-27: qty 2

## 2014-12-27 MED ORDER — METOCLOPRAMIDE HCL 10 MG PO TABS: 10.0000 mg | ORAL_TABLET | Freq: Three times a day (TID) | ORAL | Status: DC | PRN

## 2014-12-27 MED ORDER — METOCLOPRAMIDE HCL 10 MG PO TABS
10.0000 mg | ORAL_TABLET | Freq: Once | ORAL | Status: AC
  Administered 2014-12-27: 10 mg via ORAL
  Filled 2014-12-27: qty 1

## 2014-12-27 MED ORDER — KETOROLAC TROMETHAMINE 60 MG/2ML IM SOLN
60.0000 mg | Freq: Once | INTRAMUSCULAR | Status: AC
  Administered 2014-12-27: 60 mg via INTRAMUSCULAR
  Filled 2014-12-27: qty 2

## 2014-12-27 NOTE — ED Provider Notes (Signed)
CSN: [id number]     Arrival date & time 12/26/14  2327 History   This chart was scribed for Tomasita Crumble, MD by Freida Busman, ED Scribe. This patient was seen in room A13C/A13C and the patient's care was started 12:56 AM.    Chief Complaint  Patient presents with  . Headache    The patient said she has been haivng a headache since tuesday.  She has taken several things for the headache and nothing has worked.   The history is provided by the patient. No language interpreter was used.   HPI Comments:  Lauren Carrillo is a 24 y.o. female with a h/o migraines who presents to the Emergency Department complaining of a 10/10 HA that started about 3 days ago. She notes the pain is similar to past migraines but is worse than usual. She denies numbness/tingling to her extremities. Her pain is exacerbated when eating and occasionally when ambulating. She has taken aleve without relief. She also complains of nasal congestion, productive cough and diarrhea. She denies sick contacts with similar symptoms. No alleviating factors noted for cold symptoms.  History reviewed. No pertinent past medical history. History reviewed. No pertinent past surgical history. History reviewed. No pertinent family history. History  Substance Use Topics  . Smoking status: Current Every Day Smoker  . Smokeless tobacco: Never Used  . Alcohol Use: Yes   OB History    No data available     Review of Systems  A complete 10 system review of systems was obtained and all systems are negative except as noted in the HPI and PMH.    Allergies  Review of patient's allergies indicates no known allergies.  Home Medications   Prior to Admission medications   Medication Sig Start Date End Date Taking? Authorizing Provider  acetaminophen (TYLENOL) 500 MG tablet Take 500-1,000 mg by mouth every 6 (six) hours as needed for mild pain.     Historical Provider, MD  azithromycin (ZITHROMAX) 250 MG tablet Take 1 tablet (250 mg  total) by mouth daily. Take first 2 tablets together, then 1 every day until finished. Patient not taking: Reported on 09/04/2014 09/15/13   Marissa Sciacca, PA-C  Chlorpheniramine Maleate (ALLERGY PO) Take 2 tablets by mouth daily as needed (allergies).    Historical Provider, MD  metroNIDAZOLE (FLAGYL) 500 MG tablet Take 1 tablet (500 mg total) by mouth 2 (two) times daily. Patient not taking: Reported on 09/04/2014 09/15/13   Marissa Sciacca, PA-C  ondansetron (ZOFRAN ODT) 4 MG disintegrating tablet Take 1 tablet (4 mg total) by mouth every 8 (eight) hours as needed for nausea or vomiting. 09/04/14   Francee Piccolo, PA-C   BP 111/77 mmHg  Pulse 89  Temp(Src) 99.3 F (37.4 C) (Oral)  Resp 21  Wt 134 lb 12.8 oz (61.145 kg)  SpO2 96%  LMP 12/20/2014 Physical Exam  Constitutional: She is oriented to person, place, and time. She appears well-developed and well-nourished. No distress.  HENT:  Head: Normocephalic and atraumatic.  Nose: Nose normal.  Mouth/Throat: Oropharynx is clear and moist. No oropharyngeal exudate.  Eyes: Conjunctivae and EOM are normal. Pupils are equal, round, and reactive to light. No scleral icterus.  Neck: Normal range of motion. Neck supple. No JVD present. No tracheal deviation present. No thyromegaly present.  Cardiovascular: Normal rate, regular rhythm and normal heart sounds.  Exam reveals no gallop and no friction rub.   No murmur heard. Pulmonary/Chest: Effort normal and breath sounds normal. No respiratory distress. She  has no wheezes. She exhibits no tenderness.  Abdominal: Soft. Bowel sounds are normal. She exhibits no distension and no mass. There is no tenderness. There is no rebound and no guarding.  Musculoskeletal: Normal range of motion. She exhibits no edema or tenderness.  Lymphadenopathy:    She has no cervical adenopathy.  Neurological: She is alert and oriented to person, place, and time. No cranial nerve deficit. She exhibits normal muscle  tone.  Normal cerebellar testing Normal strength and sensation in all extremities   Skin: Skin is warm and dry. No rash noted. No erythema. No pallor.  Nursing note and vitals reviewed.   ED Course  Procedures   DIAGNOSTIC STUDIES:  Oxygen Saturation is 100% on RA, normal by my interpretation.    COORDINATION OF CARE:  12:59 AM Will order pain meds. Discussed treatment plan with pt at bedside and pt agreed to plan.  Labs Review Labs Reviewed - No data to display  Imaging Review No results found.   EKG Interpretation None      MDM   Final diagnoses:  None   Patient presents to the emergency department for headache. This is her chronic headache she states it is not different than her prior headaches. She denies neurological symptoms. She was given Toradol Reglan and Benadryl the emergency department. Upon repeat evaluation patient states her headache has subsided. We'll discharge with Reglan to take as needed. Panic care follow-up was advised within 3 days. Patient was found resting comfortably, she is in no acute distress, her vital signs remain within her normal limits and she is safe for discharge.   I personally performed the services described in this documentation, which was scribed in my presence. The recorded information has been reviewed and is accurate.   Tomasita Crumble, MD 12/27/14 [phone]

## 2014-12-27 NOTE — Discharge Instructions (Signed)
Recurrent Migraine Headache Ms. Oertel, take Reglan as needed for your headaches. See a primary care physician within 3 days for close follow-up. If symptoms worsen come back to the emergency department immediately. Thank you. A migraine headache is very bad, throbbing pain on one or both sides of your head. Recurrent migraines keep coming back. Talk to your doctor about what things may bring on (trigger) your migraine headaches. HOME CARE  Only take medicines as told by your doctor.  Lie down in a dark, quiet room when you have a migraine.  Keep a journal to find out if certain things bring on migraine headaches. For example, write down:  What you eat and drink.  How much sleep you get.  Any change to your diet or medicines.  Lessen how much alcohol you drink.  Quit smoking if you smoke.  Get enough sleep.  Lessen any stress in your life.  Keep lights dim if bright lights bother you or make your migraines worse. GET HELP IF:  Medicine does not help your migraines.  Your pain keeps coming back.  You have a fever. GET HELP RIGHT AWAY IF:   Your migraine becomes really bad.  You have a stiff neck.  You have trouble seeing.  Your muscles are weak, or you lose muscle control.  You lose your balance or have trouble walking.  You feel like you will pass out (faint), or you pass out.  You have really bad symptoms that are different than your first symptoms. MAKE SURE YOU:   Understand these instructions.  Will watch your condition.  Will get help right away if you are not doing well or get worse. Document Released: 04/06/2008 Document Revised: 07/03/2013 Document Reviewed: 03/05/2013 [facility] Patient Information 2015 [place], [place]. This information is not intended to replace advice given to you by your health care provider. Make sure you discuss any questions you have with your health care provider.  A general headache is pain or discomfort felt around the head or  neck area. The cause may not be found.  HOME CARE   Keep all doctor visits.  Only take medicines as told by your doctor.  Lie down in a dark, quiet room when you have a headache.  Keep a journal to find out if certain things bring on headaches. For example, write down:  What you eat and drink.  How much sleep you get.  Any change to your diet or medicines.  Relax by getting a massage or doing other relaxing activities.  Put ice or heat packs on the head and neck area as told by your doctor.  Lessen stress.  Sit up straight. Do not tighten (tense) your muscles.  Quit smoking if you smoke.  Lessen how much alcohol you drink.  Lessen how much caffeine you drink, or stop drinking caffeine.  Eat and sleep on a regular schedule.  Get 7 to 9 hours of sleep, or as told by your doctor.  Keep lights dim if bright lights bother you or make your headaches worse. GET HELP RIGHT AWAY IF:   Your headache becomes really bad.  You have a fever.  You have a stiff neck.  You have trouble seeing.  Your muscles are weak, or you lose muscle control.  You lose your balance or have trouble walking.  You feel like you will pass out (faint), or you pass out.  You have really bad symptoms that are different than your first symptoms.  You have problems with  the medicines given to you by your doctor.  Your medicines do not work.  Your headache feels different than the other headaches.  You feel sick to your stomach (nauseous) or throw up (vomit). MAKE SURE YOU:   Understand these instructions.  Will watch your condition.  Will get help right away if you are not doing well or get worse. Document Released: 04/06/2008 Document Revised: 09/20/2011 Document Reviewed: 06/18/2011 [facility] Patient Information 2015 [place], [place]. This information is not intended to replace advice given to you by your health care provider. Make sure you discuss any questions you have with your health  care provider.

## 2014-12-27 NOTE — ED Notes (Signed)
Pt stable, ambulatory, pain 2/10, family at bedside, states understanding of discharge instructions

## 2015-01-01 ENCOUNTER — Encounter (HOSPITAL_COMMUNITY): Payer: Self-pay

## 2015-01-01 ENCOUNTER — Emergency Department (HOSPITAL_COMMUNITY)
Admission: EM | Admit: 2015-01-01 | Discharge: 2015-01-01 | Disposition: A | Discharge status: Home / Self Care | Payer: Self-pay | Attending: Emergency Medicine | Admitting: Emergency Medicine

## 2015-01-01 ENCOUNTER — Emergency Department (HOSPITAL_COMMUNITY): Payer: Self-pay

## 2015-01-01 DIAGNOSIS — R51 Headache: Secondary | ICD-10-CM | POA: Insufficient documentation

## 2015-01-01 DIAGNOSIS — R519 Headache, unspecified: Secondary | ICD-10-CM

## 2015-01-01 DIAGNOSIS — Z720 Tobacco use: Secondary | ICD-10-CM | POA: Insufficient documentation

## 2015-01-01 DIAGNOSIS — R059 Cough, unspecified: Secondary | ICD-10-CM

## 2015-01-01 DIAGNOSIS — R05 Cough: Secondary | ICD-10-CM | POA: Insufficient documentation

## 2015-01-01 MED ORDER — DIPHENHYDRAMINE HCL 50 MG/ML IJ SOLN
25.0000 mg | Freq: Once | INTRAMUSCULAR | Status: AC
  Administered 2015-01-01: 25 mg via INTRAVENOUS
  Filled 2015-01-01: qty 1

## 2015-01-01 MED ORDER — PROCHLORPERAZINE MALEATE 10 MG PO TABS: 10.0000 mg | ORAL_TABLET | Freq: Four times a day (QID) | ORAL | Status: DC | PRN

## 2015-01-01 MED ORDER — KETOROLAC TROMETHAMINE 30 MG/ML IJ SOLN
30.0000 mg | Freq: Once | INTRAMUSCULAR | Status: AC
  Administered 2015-01-01: 30 mg via INTRAVENOUS
  Filled 2015-01-01: qty 1

## 2015-01-01 MED ORDER — SODIUM CHLORIDE 0.9 % IV BOLUS (SEPSIS)
1000.0000 mL | Freq: Once | INTRAVENOUS | Status: AC
  Administered 2015-01-01: 1000 mL via INTRAVENOUS

## 2015-01-01 MED ORDER — METOCLOPRAMIDE HCL 5 MG/ML IJ SOLN
10.0000 mg | Freq: Once | INTRAMUSCULAR | Status: AC
  Administered 2015-01-01: 10 mg via INTRAVENOUS
  Filled 2015-01-01: qty 2

## 2015-01-01 NOTE — ED Notes (Signed)
Pt complains of a migraine for two weeks, was seen at [facility] and the meds didn't help

## 2015-01-01 NOTE — Discharge Instructions (Signed)
Return to the emergency room with worsening of symptoms, new symptoms or with symptoms that are concerning , especially severe worsening of headache, visual or speech changes, weakness in face, arms or legs OR , especially fevers, stiff neck, worsening headache, nausea/vomiting, visual changes or slurred speech, chest pain, shortness of breath, cough with thick colored mucous or blood Drink plenty of fluids with electrolytes especially Gatorade. OTC cold medications such as mucinex, nyquil, dayquil are recommended. Chloraseptic for sore throat. Please call your doctor for a followup appointment within 24-48 hours. When you talk to your doctor please let them know that you were seen in the emergency department and have them acquire all of your records so that they can discuss the findings with you and formulate a treatment plan to fully care for your new and ongoing problems. If you do not have a primary care provider please call the number below under ED resources to establish care with a provider and follow up.    Emergency Department Resource Guide 1) Find a Doctor and Pay Out of Pocket Although you won't have to find out who is covered by your insurance plan, it is a good idea to ask around and get recommendations. You will then need to call the office and see if the doctor you have chosen will accept you as a new patient and what types of options they offer for patients who are self-pay. Some doctors offer discounts or will set up payment plans for their patients who do not have insurance, but you will need to ask so you aren't surprised when you get to your appointment.  2) Contact Your Local Health Department Not all health departments have doctors that can see patients for sick visits, but many do, so it is worth a call to see if yours does. If you don't know where your local health department is, you can check in your phone book. The CDC also has a tool to help you locate your state's health  department, and many state websites also have listings of all of their local health departments.  3) Find a Walk-in Clinic If your illness is not likely to be very severe or complicated, you may want to try a walk in clinic. These are popping up all over the country in pharmacies, drugstores, and shopping centers. They're usually staffed by nurse practitioners or physician assistants that have been trained to treat common illnesses and complaints. They're usually fairly quick and inexpensive. However, if you have serious medical issues or chronic medical problems, these are probably not your best option.  No Primary Care Doctor: - Call Health Connect at  [phone] - they can help you locate a primary care doctor that  accepts your insurance, provides certain services, etc. - Physician Referral Service- [phone]  Chronic Pain Problems: Organization         Address  Phone   Notes  [facility]  [phone] Patients need to be referred by their primary care doctor.   Medication Assistance: Organization         Address  Phone   Notes  [facility] Medication [facility] [address] [place] [place], [place] [address] [phone] --Must be a resident of [facility] -- Must have NO insurance coverage whatsoever (no Medicaid/ Medicare, etc.) -- The pt. MUST have a primary care doctor that directs their care regularly and follows them in the community   [facility]ist  [phone]   [address]  (  [phone]    Agencies that provide inexpensive medical care: Organization         Address  Phone   Notes  [facility]  [phone]   [facility]    [phone]   [facility] [address] [place], [place] [address] [phone]   [facility] [address] [place] [place] [phone]   Planned Parenthood    [phone]   [facility]    [phone]    [facility]  [address], [place] Phone:  [phone], Fax:  [phone] Hours of Operation:  9 am - 6 pm, M-F.  Also accepts Medicaid/Medicare and self-pay.  [facility]  [address], [place] Phone: [phone], Fax: [phone]. Hours of Operation:  8:30 am - 5:30 pm, M-F.  Also accepts Medicaid and self-pay.  [facility] [place] [address] [place] Phone: [phone]   [facility] [address] Natasha Bence [place], [place] [phone], Ext. [phone] Mondays & Thursdays: 7-9 AM.  First 15 patients are seen on a first come, first serve basis.    Medicaid-accepting [facility] Providers:  Organization         Address  Phone   Notes  [facility] [address], [place] [phone] Also accepts self-pay patients.  [facility] [address] Laurell Josephs [place], [place]  [phone]   [facility] [address], [place] [phone]   [facility] [address], [place] [phone]   Renaye Rakers [address], [place]   [phone] Only accepts [place] patients after they have their name applied to their card.   Self-Pay (no insurance) in [facility]:  Organization         Address  Phone   Notes  Sickle Cell Patients, [facility] Internal Medicine [address] [place], [place] [phone]   [facility] [address] [place], [place] [phone]   [facility] [place]  [address], [place] [phone]   [facility]/Dr. Osei-Bonsu  [address], [place] or [address], [place] [phone] Phone number for both [place] and [place] locations is the same.  [facility] [address] [place] [phone]    [facility] [address] [place] or [address] [phone]   [facility] [address] [place] [phone], phone; [phone], fax Sees patients 1st and 3rd Saturday of every month.  Must not qualify for public or private insurance (i.e. Medicaid, Medicare, [place] Health Choice, Veterans' Benefits)  Household income should be no more than 200% of the poverty level The clinic cannot treat you if you are pregnant or think you are pregnant  Sexually transmitted diseases are not treated at the clinic.    Dental Care: Organization         Address  Phone  Notes  [facility] Department of [facility] [address] [place], [place] [phone] Accepts children up to age 107 who are enrolled in [place] or [place] Health Choice; pregnant women with a Medicaid card; and children who  have applied for Medicaid or [place] Health Choice, but were declined, whose parents can pay a reduced fee at time of service.  [facility] Department of [facility]  [address], [place] [phone] Accepts children up to age 65 who are enrolled in [place] or [place] Health Choice; pregnant women with a Medicaid card; and children who have applied for Medicaid or  Health Choice, but were declined, whose parents can pay a reduced fee at time of service.  [facility]  [address] [place], [place] [phone] Patients are seen by appointment only. Walk-ins are not accepted. [facility] will see patients 49 years of age and older. Monday - Tuesday (8am-5pm) Most Wednesdays (8:30-5pm) $30 per visit, cash only  [facility] Adult Dental Access PROGRAM  [address], [facility] [phone] Patients are seen by appointment only. Walk-ins are not accepted. [facility] will see patients 67 years of age and older. One Wednesday Evening (Monthly: Volunteer Based).   $30 per visit, cash only  [employer]  [phone] for adults; Children under age 47, call Graduate Pediatric Dentistry at [phone]. Children aged 97-14, please call [phone] to request a pediatric application.  Dental services are provided in all areas of dental care including fillings, crowns and bridges, complete and partial dentures, implants, gum treatment, root canals, and extractions. Preventive care is also provided. Treatment is provided to both adults and children. Patients are selected via a lottery and there is often a waiting list.   [facility] [address] [place]  [phone] [email or web address]   [facility] [address] [place], [place] [phone], Ext. [phone] Second and Fourth Thursday of each month, opens at 6:30 AM; Clinic ends at 9 AM.  Patients are seen on a first-come first-served basis, and a limited number are seen during each clinic.   [facility]  [address] Ether Griffins [place], [place] [phone]   Eligibility Requirements You must have lived in [place], [place], or [place] counties for at least the last three months.   You cannot be eligible for state or federal sponsored [place], including [employer], [place], or [employer].   You generally cannot be eligible for healthcare insurance through your employer.    How to apply: Eligibility screenings are held every Tuesday and Wednesday afternoon from 1:00 pm until 4:00 pm. You do not need an appointment for the interview!  [facility] [address] [place], [place] [phone]   [facility] Health Department  [phone]   [facility] Health Department  [phone]   [facility] Health Department  [phone]    Behavioral Health Resources in the Community: Intensive Outpatient Programs Organization         Address  Phone  Notes  [facility] [address], [place], [place] [phone]   [facility] [address] [place], [place] [phone]   [facility] [address] [place], [place]  [phone]   [facility] [address],  [place], [place] [phone] or [phone]   Substance Abuse Resources Organization         Address  Phone  Notes  [facility]  [phone]   [facility]  [phone]   [facility]  [phone]   [facility]  [phone]   Residential & Outpatient  Substance Abuse Program  [phone]   Psychological Services Organization         Address  Phone  Notes  [facility] Behavioral Health  [phone]   [facility]  [phone]   [facility] Mental Health [phone] N[address], [place] [phone] or [phone]    Mobile Crisis Teams Organization         Address  Phone  Notes  [facility], Mobile Crisis Care Unit  [phone]   [facility]  [address] [place], [place] [phone]   Doristine Locks [address] [place] [phone]    Self-Help/Support Groups Organization         Address  Phone             Notes  [facility] [place] - variety of support groups  336- I7437963 Call for more information  Narcotics Anonymous (NA), [facility] [address], [employer] [place]  2 meetings at this location   Statistician         Address  Phone  Notes  [facility] [address],    [place]  [phone]   [facility]  [address], [place] [address], [place], [place] [phone]   [facility] [address] [place], [place] [phone] Admissions: 8am-3pm M-F  [facility] [address],    [place], [place] [phone]   [facility] [address] [place], [place], [place] [phone]   [facility] [address]  [place], [place] [phone]   [facility] - Intensive Outpatient [address] Laurell Josephs [address], [place], [place] [phone]   [facility] ([facility] [address] [place]  [place], [place] [phone] or [phone]   [facility] ([facility]) [address], [place], [place] [phone] Accepts Medicaid  [facility] [address],  [place] [phone] Substance Abuse/Addiction Treatment   [facility] Organization         Address  Phone  Notes  [facility]  [phone]   Angie Fava, PhD [address] Ervin Knack [place], [place]   [phone] or [phone]   [facility] Behavioral   [address] [place], [place] [phone]   [facility] [address] [place], [place] [phone] Insurance/Medicaid/sponsorship through [facility] and Families [address]                                    [place], [place] [phone] Therapy/tele-psych/case  [facility] [address], [place] [phone]    Dr. Lolly Mustache  [phone]   [facility]  [facility] Dept. 1) [address], [place] 2) [address], [place] 3)  [address], [place] [phone]  [phone]   [facility] Child Abuse Hotline 347-558-1844 or [phone] (After Hours)

## 2015-01-01 NOTE — Progress Notes (Signed)
EDCM spoke to patient at bedside.  Patient recently seen at [facility] for [facility].  Patient reports she does not have a history of chronic headaches, no family history that she knows of chronic headaches.  [facility] asked patient if she was under stress in her life or could she think of anything that could have triggered her headache?  Patient just shook her head.   [facility] provided patient with pamphlet to [facility], informed patient of services there and walk in times.  EDCM also provided patient with list of pcps who accept self pay patients, list of discount pharmacies and websites needymeds.org and GoodRX.com for medication assistance, phone number to inquire about the orange card, phone number to inquire about Mediciad, phone number to inquire about the Affordable Care Act, financial resources in the community such as local churches, salvation army, urban ministries, and dental assistance for uninsured patients.  Patient thankful for resources.  No further EDCM needs at this time.

## 2015-01-01 NOTE — ED Provider Notes (Signed)
CSN: [id number]     Arrival date & time 01/01/15  1944 History   First MD Initiated Contact with Patient 01/01/15 2031     Chief Complaint  Patient presents with  . Migraine     (Consider location/radiation/quality/duration/timing/severity/associated sxs/prior Treatment) HPI  Lauren Carrillo is a 24 y.o. female with PMH of headaches presenting with intermittent headache for 2 weeks described as frontal and throbbing. It developed gradually and is like other headaches she has had before. Patient has taken over-the-counter pain medications without any relief. She denies. Ears, chills, neck pain, visual changes, slurred speech, weakness. No nausea or vomiting. Patient also endorsing rhinorrhea and productive cough. No SOB, Chest pain. No numbness tingling, dizziness.   History reviewed. No pertinent past medical history. History reviewed. No pertinent past surgical history. History reviewed. No pertinent family history. History  Substance Use Topics  . Smoking status: Current Every Day Smoker  . Smokeless tobacco: Never Used  . Alcohol Use: Yes   OB History    No data available     Review of Systems 10 Systems reviewed and are negative for acute change except as noted in the HPI.    Allergies  Review of patient's allergies indicates no known allergies.  Home Medications   Prior to Admission medications   Medication Sig Start Date End Date Taking? Authorizing Provider  GuaiFENesin (MUCUS RELIEF ADULT PO) Take 1 tablet by mouth daily as needed (congestion).   Yes Historical Provider, MD  metoCLOPramide (REGLAN) 10 MG tablet Take 1 tablet (10 mg total) by mouth every 8 (eight) hours as needed (headache). 12/27/14  Yes Tomasita Crumble, MD  ondansetron (ZOFRAN ODT) 4 MG disintegrating tablet Take 1 tablet (4 mg total) by mouth every 8 (eight) hours as needed for nausea or vomiting. Patient not taking: Reported on 12/27/2014 09/04/14   Francee Piccolo, PA-C  prochlorperazine  (COMPAZINE) 10 MG tablet Take 1 tablet (10 mg total) by mouth every 6 (six) hours as needed for nausea or vomiting (Headache). 01/01/15   Oswaldo Conroy, PA-C   BP 94/56 mmHg  Pulse 102  Temp(Src) 98.2 F (36.8 C) (Oral)  Resp 18  Ht 5\' 2"  (1.575 m)  Wt 134 lb (60.782 kg)  BMI 24.50 kg/m2  SpO2 98%  LMP 12/20/2014 Physical Exam  Constitutional: She appears well-developed and well-nourished. No distress.  HENT:  Head: Normocephalic and atraumatic.  Mouth/Throat: Oropharynx is clear and moist.  Eyes: Conjunctivae and EOM are normal. Pupils are equal, round, and reactive to light. Right eye exhibits no discharge. Left eye exhibits no discharge.  Neck: Normal range of motion. Neck supple.  No nuchal rigidity  Cardiovascular: Normal rate and regular rhythm.   Pulmonary/Chest: Effort normal and breath sounds normal. No respiratory distress. She has no wheezes.  Abdominal: Soft. Bowel sounds are normal. She exhibits no distension. There is no tenderness.  Neurological: She is alert. No cranial nerve deficit. Coordination normal.  Speech is clear and goal oriented. Peripheral visual fields intact. Strength 5/5 in upper and lower extremities. Sensation intact. Intact rapid alternating movements, finger to nose, and heel to shin. Negative Romberg. No pronator drift. Normal gait.   Skin: Skin is warm and dry. She is not diaphoretic.  Nursing note and vitals reviewed.   ED Course  Procedures (including critical care time) Labs Review Labs Reviewed - No data to display  Imaging Review Dg Chest 2 View  01/01/2015   CLINICAL DATA:  Productive cough.  Migraine headache.  EXAM: CHEST  2 VIEW  COMPARISON:  Chest radiograph 09/15/2013  FINDINGS: Stable cardiac and mediastinal contours. No consolidative pulmonary opacities. No pleural effusion or pneumothorax. Regional skeleton is unremarkable.  IMPRESSION: No acute cardiopulmonary process.   Electronically Signed   By: Annia Belt M.D.   On:  01/01/2015 21:27     EKG Interpretation None      MDM   Final diagnoses:  Cough  Nonintractable headache   Patient presenting with history of headaches with similar headache that is not worst of life, maximal in intensity or at onset. VSS. Normal neurological exam. Patient treated with headache cocktail reports significant improvement. I doubt subarachnoid, intracranial hemorrhage, meningitis. Patient also with cough congestion. Negative chest x-ray and patient afebrile. Discussed symptomatic treatment and follow-up with primary care provider. Patient given list of ED resources to establish care.  Discussed return precautions with patient. Discussed all results and patient verbalizes understanding and agrees with plan.     Oswaldo Conroy, PA-C 01/01/15 2232  Pricilla Loveless, MD 01/07/15 1416

## 2015-01-01 NOTE — ED Notes (Signed)
Pt states has had headache x 2 weeks, [facility] did not help. Pt a/o x 4.

## 2015-01-20 ENCOUNTER — Emergency Department (HOSPITAL_COMMUNITY): Payer: Self-pay

## 2015-01-20 ENCOUNTER — Emergency Department (HOSPITAL_COMMUNITY)
Admission: EM | Admit: 2015-01-20 | Discharge: 2015-01-20 | Discharge status: Against Medical Advice | Payer: Self-pay | Attending: Emergency Medicine | Admitting: Emergency Medicine

## 2015-01-20 ENCOUNTER — Encounter (HOSPITAL_COMMUNITY): Payer: Self-pay

## 2015-01-20 DIAGNOSIS — Z720 Tobacco use: Secondary | ICD-10-CM | POA: Insufficient documentation

## 2015-01-20 DIAGNOSIS — R079 Chest pain, unspecified: Secondary | ICD-10-CM | POA: Insufficient documentation

## 2015-01-20 DIAGNOSIS — R05 Cough: Secondary | ICD-10-CM | POA: Insufficient documentation

## 2015-01-20 LAB — BASIC METABOLIC PANEL
Anion gap: 6 (ref 5–15)
BUN: 8 mg/dL (ref 6–20)
CO2: 28 mmol/L (ref 22–32)
Calcium: 9.2 mg/dL (ref 8.9–10.3)
Chloride: 109 mmol/L (ref 101–111)
Creatinine, Ser: 0.79 mg/dL (ref 0.44–1.00)
Glucose, Bld: 93 mg/dL (ref 65–99)
Potassium: 3.5 mmol/L (ref 3.5–5.1)
SODIUM: 143 mmol/L (ref 135–145)

## 2015-01-20 LAB — CBC
HCT: 40.2 % (ref 36.0–46.0)
Hemoglobin: 13.4 g/dL (ref 12.0–15.0)
MCH: 28.9 pg (ref 26.0–34.0)
MCHC: 33.3 g/dL (ref 30.0–36.0)
MCV: 86.8 fL (ref 78.0–100.0)
Platelets: 215 10*3/uL (ref 150–400)
RBC: 4.63 MIL/uL (ref 3.87–5.11)
RDW: 14 % (ref 11.5–15.5)
WBC: 9.6 10*3/uL (ref 4.0–10.5)

## 2015-01-20 LAB — TROPONIN I: Troponin I: <0.03 ng/mL (ref <0.031)

## 2015-01-20 NOTE — ED Notes (Signed)
Called x 1 w/o answer.

## 2015-01-20 NOTE — ED Notes (Signed)
Pt complains of centralized chest pain and a cough since this am, she describes it as a sharp pain

## 2015-01-23 ENCOUNTER — Telehealth: Payer: Self-pay | Admitting: *Deleted

## 2015-01-23 ENCOUNTER — Encounter: Payer: Self-pay | Admitting: *Deleted

## 2015-01-23 NOTE — Telephone Encounter (Signed)
Pre-Visit Call completed with patient and chart updated.   Pre-Visit Info documented in Specialty Comments under Snap[employer]ot.    

## 2015-01-24 ENCOUNTER — Encounter: Payer: Self-pay | Admitting: Medical

## 2015-01-24 ENCOUNTER — Ambulatory Visit (INDEPENDENT_AMBULATORY_CARE_PROVIDER_SITE_OTHER): Payer: PRIVATE HEALTH INSURANCE | Admitting: Medical

## 2015-01-24 VITALS — BP 105/55 | HR 77 | Temp 98.2°F | Ht 62.0 in | Wt 132.6 lb

## 2015-01-24 DIAGNOSIS — M940 Chondrocostal junction syndrome [Tietze]: Secondary | ICD-10-CM

## 2015-01-24 DIAGNOSIS — R059 Cough, unspecified: Secondary | ICD-10-CM

## 2015-01-24 DIAGNOSIS — Z720 Tobacco use: Secondary | ICD-10-CM

## 2015-01-24 DIAGNOSIS — R05 Cough: Secondary | ICD-10-CM | POA: Diagnosis not present

## 2015-01-24 DIAGNOSIS — J208 Acute bronchitis due to other specified organisms: Secondary | ICD-10-CM | POA: Diagnosis not present

## 2015-01-24 DIAGNOSIS — R0981 Nasal congestion: Secondary | ICD-10-CM

## 2015-01-24 DIAGNOSIS — F17200 Nicotine dependence, unspecified, uncomplicated: Secondary | ICD-10-CM

## 2015-01-24 DIAGNOSIS — J209 Acute bronchitis, unspecified: Secondary | ICD-10-CM | POA: Insufficient documentation

## 2015-01-24 MED ORDER — BENZONATATE 100 MG PO CAPS: 100.0000 mg | ORAL_CAPSULE | Freq: Three times a day (TID) | ORAL | Status: DC | PRN

## 2015-01-24 MED ORDER — FLUTICASONE PROPIONATE 50 MCG/ACT NA SUSP: 2.0000 | Freq: Every day | NASAL | Status: DC

## 2015-01-24 NOTE — Patient Instructions (Signed)
I think you are getting better regarding bronchitis. Continue zpack. Consider stopping smoking as this often contributes to infection.  For costochondritis take alleve otc twice daily if this were to reoccur.  Cough- rx benzonatate. Congestion- rx flonase.  Follow up in 7 days  Or as needed

## 2015-01-24 NOTE — Progress Notes (Signed)
Pre visit review using our clinic review tool, if applicable. No additional management support is needed unless otherwise documented below in the visit note. 

## 2015-01-24 NOTE — Progress Notes (Signed)
   Subjective:    Patient ID: Lauren Carrillo, female    DOB: [birth year], 24 y.o.   MRN: [id number]  HPI  I have reviewed pt PMH, PSH, FH, Social History and Surgical History  Pt work Adult nursepro logistics, no exercise, trying to eat healthy, 1-2 cups coffee a week, boyfriend- no children   Pt in states for about one week of chest congestion and coughing up mucous. Also nasal congestion and runny nose. No sneezing, or itching eyes.  No fever, no chills but some more sweatiness. Some at night.  Pt went to [facility] on Tuesday. Pt was given azithromycin. Pt feels a little better. Pt states before she went to [facility] she had cxr and blood work done at [facility] but took too long so she left. cxr was clear that day. Her troponin that day was neg. Pt cbc and cmp negative as well. Pt had ekg that day as well and looked normal.  Pt states when moves occasional rt sides chest pain. Points to costochandral junction rt side. But none now. And went away yesterday afternoon. Has not come back.  Cough is some improved.   lmp- January 20, 2015.  Pt left tessalon perles in her car and melted.     Review of Systems  Constitutional: Negative for fever, chills and fatigue.  HENT: Positive for congestion and rhinorrhea.   Respiratory: Positive for cough. Negative for chest tightness, shortness of breath and wheezing.   Cardiovascular: Negative for chest pain and palpitations.  Gastrointestinal: Negative for abdominal pain.  Musculoskeletal: Negative for back pain.       No leg pain.  Hematological: Negative for adenopathy. Does not bruise/bleed easily.  Psychiatric/Behavioral: Negative for behavioral problems and confusion.         Objective:   Physical Exam  General  Mental Status - Alert. General Appearance - Well groomed. Not in acute distress.  Skin Rashes- No Rashes.  HEENT Head- Normal. Ear Auditory Canal - Left- Normal. Right - Normal.Tympanic Membrane- Left- Normal. Right-  Normal. Eye Sclera/Conjunctiva- Left- Normal. Right- Normal. Nose & Sinuses Nasal Mucosa- Left-  Not oggy or Congested. Right-  Not  boggy or Congested. Mouth & Throat Lips: Upper Lip- Normal: no dryness, cracking, pallor, cyanosis, or vesicular eruption. Lower Lip-Normal: no dryness, cracking, pallor, cyanosis or vesicular eruption. Buccal Mucosa- Bilateral- No Aphthous ulcers. Oropharynx- No Discharge or Erythema. Tonsils: Characteristics- Bilateral- No Erythema or Congestion. Size/Enlargement- Bilateral- No enlargement. Discharge- bilateral-None.  Neck Neck- Supple. No Masses.   Chest and Lung Exam Auscultation: Breath Sounds:- even and unlabored,   Cardiovascular Auscultation:Rythm- Regular, rate and rhythm. Murmurs & Other Heart Sounds:Ausculatation of the heart reveal- No Murmurs.  Lymphatic Head & Neck General Head & Neck Lymphatics: Bilateral: Description- No Localized lymphadenopathy.       Assessment & Plan:  I think you are getting better regarding bronchitis. Continue zpack. Consider stopping smoking as this often contributes to infection.  For costochondritis take alleve otc twice daily if this were to reoccur.  Cough- rx benzonatate. Congestion- rx flonase.  Follow up in 7 days  Or as needed

## 2016-07-12 NOTE — L&D Delivery Note (Addendum)
Operative Delivery Note At 3:42 PM a viable and healthy female "Lauren Carrillo" was delivered via Vaginal, Vacuum Investment banker, operational(Extractor).  Presentation: vertex; Position: Right,, Occiput,, Anterior; Station: +4.  FHT in 60s x3 min with excellent maternal effort. Vacuum applied x1 push, no pop-offs  Verbal consent: obtained from patient.  Risks and benefits discussed in detail.  Risks include, but are not limited to the risks of anesthesia, bleeding, infection, damage to maternal tissues, fetal cephalhematoma.  There is also the risk of inability to effect vaginal delivery of the head, or shoulder dystocia that cannot be resolved by established maneuvers, leading to the need for emergency cesarean section.  APGAR: 9, 9; weight 6 lb 11.6 oz (3050 g).   Placenta status: expressed ,  intact.   Cord:  with the following complications: none  Anesthesia:  epidural Instruments: Kiwi vacuum Episiotomy: None Lacerations: Periurethral Suture Repair: 3.0 vicryl rapide Est. Blood Loss (mL):  300  Mom to postpartum.  Baby to Couplet care / Skin to Skin.  Christeen DouglasBethany Gurnoor Ursua 05/05/2017, 4:02 PM  Babies name is pronounced: "[id number]-[id number]"

## 2016-12-30 DIAGNOSIS — O99112 Other diseases of the blood and blood-forming organs and certain disorders involving the immune mechanism complicating pregnancy, second trimester: Secondary | ICD-10-CM | POA: Diagnosis not present

## 2016-12-30 DIAGNOSIS — O99332 Smoking (tobacco) complicating pregnancy, second trimester: Secondary | ICD-10-CM | POA: Insufficient documentation

## 2016-12-30 DIAGNOSIS — O26892 Other specified pregnancy related conditions, second trimester: Secondary | ICD-10-CM | POA: Diagnosis present

## 2016-12-30 DIAGNOSIS — O99612 Diseases of the digestive system complicating pregnancy, second trimester: Secondary | ICD-10-CM | POA: Insufficient documentation

## 2016-12-30 DIAGNOSIS — Z3A21 21 weeks gestation of pregnancy: Secondary | ICD-10-CM | POA: Insufficient documentation

## 2016-12-30 DIAGNOSIS — O99322 Drug use complicating pregnancy, second trimester: Secondary | ICD-10-CM | POA: Diagnosis not present

## 2016-12-31 ENCOUNTER — Telehealth: Payer: Self-pay | Admitting: Medical

## 2016-12-31 ENCOUNTER — Observation Stay
Admission: EM | Admit: 2016-12-31 | Discharge: 2016-12-31 | Disposition: A | Discharge status: Home / Self Care | Payer: Medicaid Other | Attending: Obstetrics & Gynecology | Admitting: Obstetrics & Gynecology

## 2016-12-31 HISTORY — DX: Personal history of other diseases of the digestive system: Z87.19

## 2016-12-31 LAB — RAPID HIV SCREEN (HIV 1/2 AB+AG)
HIV 1/2 Antibodies: NONREACTIVE
HIV-1 P24 Antigen - HIV24: NONREACTIVE

## 2016-12-31 LAB — CBC
HCT: 37.4 % (ref 35.0–47.0)
Hemoglobin: 13.1 g/dL (ref 12.0–16.0)
MCH: 30.5 pg (ref 26.0–34.0)
MCHC: 35.1 g/dL (ref 32.0–36.0)
MCV: 87 fL (ref 80.0–100.0)
PLATELETS: 275 10*3/uL (ref 150–440)
RBC: 4.3 MIL/uL (ref 3.80–5.20)
RDW: 14.4 % (ref 11.5–14.5)
WBC: 25.3 10*3/uL — ABNORMAL HIGH (ref 3.6–11.0)

## 2016-12-31 LAB — TYPE AND SCREEN
ABO/RH(D): O POS
Antibody Screen: NEGATIVE

## 2016-12-31 LAB — URINE DRUG SCREEN, QUALITATIVE (ARMC ONLY)
AMPHETAMINES, UR SCREEN: NOT DETECTED
Barbiturates, Ur Screen: NOT DETECTED
Benzodiazepine, Ur Scrn: NOT DETECTED
COCAINE METABOLITE, UR ~~LOC~~: NOT DETECTED
Cannabinoid 50 Ng, Ur ~~LOC~~: POSITIVE — AB
MDMA (ECSTASY) UR SCREEN: NOT DETECTED
METHADONE SCREEN, URINE: NOT DETECTED
Opiate, Ur Screen: NOT DETECTED
Phencyclidine (PCP) Ur S: NOT DETECTED
TRICYCLIC, UR SCREEN: NOT DETECTED

## 2016-12-31 LAB — DIFFERENTIAL
Basophils Absolute: 0 10*3/uL (ref 0–0.1)
Basophils Relative: 0 %
Eosinophils Absolute: 0.1 10*3/uL (ref 0–0.7)
Eosinophils Relative: 1 %
LYMPHS ABS: 2.9 10*3/uL (ref 1.0–3.6)
Lymphocytes Relative: 12 %
MONO ABS: 1.3 10*3/uL — AB (ref 0.2–0.9)
Monocytes Relative: 5 %
Neutro Abs: 20.9 10*3/uL — ABNORMAL HIGH (ref 1.4–6.5)
Neutrophils Relative %: 82 %

## 2016-12-31 LAB — URINALYSIS, ROUTINE W REFLEX MICROSCOPIC
BACTERIA UA: NONE SEEN
BILIRUBIN URINE: NEGATIVE
GLUCOSE, UA: NEGATIVE mg/dL
HGB URINE DIPSTICK: NEGATIVE
KETONES UR: 5 mg/dL — AB
NITRITE: NEGATIVE
PH: 5 (ref 5.0–8.0)
Protein, ur: 30 mg/dL — AB
Specific Gravity, Urine: 1.033 — ABNORMAL HIGH (ref 1.005–1.030)

## 2016-12-31 NOTE — Telephone Encounter (Signed)
Left pt a message to call back. 

## 2016-12-31 NOTE — Discharge Summary (Signed)
Lauren Carrillo is a 26 y.o. female. She is at [redacted]w[redacted]d gestation. Patient's last menstrual period was 07/31/2016 (exact date). Estimated Date of Delivery: 05/07/17  Prenatal care site: none - has appt with [facility] July 11  Chief complaint: back pain  Location: mid-back Onset/timing: 2000 12/30/16 Duration: acute Quality: achy Severity: mild Aggravating or alleviating conditions: none Associated signs/symptoms: no neck pain, dysuria, trauma, difficulty with movement.  no CTX, no VB.no LOF,  Active fetal movement.  Context:  Lauren Carrillo has a sure LMP and confirmed early pregnancy test at [facility] but otherwise no prenatal care.  She has a 25mo infant at home.  She presented today with acute onset mid back pain that has since resolved upon presentation.  She smokes marijuana and cigars.  She denies recent illness, +diarrhea in the last couple days but resolved.  Otherwise no known significant medical or surgical history.    Maternal Medical History:   Past Medical History:  Diagnosis Date  . History of hiatal hernia     Past Surgical History:  Procedure Laterality Date  . NO PAST SURGERIES    . WISDOM TOOTH EXTRACTION      No Known Allergies  Prior to Admission medications   Medication Sig Start Date End Date Taking? Authorizing Provider  azithromycin (ZITHROMAX) 250 MG tablet Take 250 mg by mouth daily.    [provider]  benzonatate (TESSALON) 100 MG capsule Take 1 capsule (100 mg total) by mouth 3 (three) times daily as needed. Patient not taking: Reported on 12/31/2016 01/24/15   Saguier, Lauren Dredge, PA-C  fluticasone [facility]) 50 MCG/ACT nasal spray Place 2 sprays into both nostrils daily. Patient not taking: Reported on 12/31/2016 01/24/15   Saguier, Lauren Dredge, PA-C     Social History: She  reports that she has been smoking Cigars.  She has a 4.00 pack-year smoking history. She has never used smokeless tobacco. She reports that she drinks alcohol. She reports that she  uses drugs, including Marijuana, about 7 times per week.  Family History:  no history of gyn cancers  Review of Systems: A full review of systems was performed and negative except as noted in the HPI.     O:  BP 111/63 (BP Location: Left Arm)   Pulse 90   Temp 98.2 F (36.8 C) (Oral)   Resp 16   Ht 5\' 1"  (1.549 m)   Wt 71.7 kg (158 lb)   LMP 07/31/2016 (Exact Date)   SpO2 98%   BMI 29.85 kg/m  Results for orders placed or performed during the hospital encounter of 12/31/16 (from the past 48 hour(s))  CBC   Collection Time: 12/31/16  1:23 AM  Result Value Ref Range   WBC 25.3 (H) 3.6 - 11.0 K/uL   RBC 4.30 3.80 - 5.20 MIL/uL   Hemoglobin 13.1 12.0 - 16.0 g/dL   HCT 16.1 09.6 - 04.5 %   MCV 87.0 80.0 - 100.0 fL   MCH 30.5 26.0 - 34.0 pg   MCHC 35.1 32.0 - 36.0 g/dL   RDW 40.9 81.1 - 91.4 %   Platelets 275 150 - 440 K/uL  Differential   Collection Time: 12/31/16  1:23 AM  Result Value Ref Range   Neutrophils Relative % 82 %   Neutro Abs 20.9 (H) 1.4 - 6.5 K/uL   Lymphocytes Relative 12 %   Lymphs Abs 2.9 1.0 - 3.6 K/uL   Monocytes Relative 5 %   Monocytes Absolute 1.3 (H) 0.2 - 0.9 K/uL  Eosinophils Relative 1 %   Eosinophils Absolute 0.1 0 - 0.7 K/uL   Basophils Relative 0 %   Basophils Absolute 0.0 0 - 0.1 K/uL  Rapid HIV screen (HIV 1/2 Ab+Ag)   Collection Time: 12/31/16  1:23 AM  Result Value Ref Range   HIV-1 P24 Antigen - HIV24 NON REACTIVE NON REACTIVE   HIV 1/2 Antibodies NON REACTIVE NON REACTIVE   Interpretation (HIV Ag Ab)      A non reactive test result means that HIV 1 or HIV 2 antibodies and HIV 1 p24 antigen were not detected in the specimen.  Type and screen [facility]   Collection Time: 12/31/16  1:23 AM  Result Value Ref Range   ABO/RH(D) O POS    Antibody Screen NEG    Sample Expiration 01/03/2017   Urine Drug Screen, Qualitative ([facility] only)   Collection Time: 12/31/16  3:18 AM  Result Value Ref Range   Tricyclic, Ur  Screen NONE DETECTED NONE DETECTED   Amphetamines, Ur Screen NONE DETECTED NONE DETECTED   MDMA (Ecstasy)Ur Screen NONE DETECTED NONE DETECTED   Cocaine Metabolite,Ur [place] NONE DETECTED NONE DETECTED   Opiate, Ur Screen NONE DETECTED NONE DETECTED   Phencyclidine (PCP) Ur S NONE DETECTED NONE DETECTED   Cannabinoid 50 Ng, Ur [place] POSITIVE (A) NONE DETECTED   Barbiturates, Ur Screen NONE DETECTED NONE DETECTED   Benzodiazepine, Ur Scrn NONE DETECTED NONE DETECTED   Methadone Scn, Ur NONE DETECTED NONE DETECTED     Constitutional: NAD, AAOx3  HE/ENT: extraocular movements grossly intact, moist mucous membranes.  Full ROM neck. CV: RRR PULM: nl respiratory effort, CTABL     Abd: gravid, non-tender, non-distended, soft      Ext: Non-tender, Nonedmeatous   Psych: mood appropriate, speech normal Pelvic deferred Back: no point tenderness in cervical, thoracic, lumbar or sacral spines.  No CVA.     Fetal hearts auscultated at 155   A/P: 26 y.o. 6639w6d here for resolved back pain.  Preterm Labor: not present.   Back pain: not present  Leukocytosis: UA sent, but no obvious source; could be resolving from recent diarrhea.  D/c home stable, precautions reviewed, follow-up as scheduled.   Recommended staying with hospital system of choice for continuity of care purposes.  ----- Lauren Plumberhelsea Ward, MD Attending Obstetrician and Gynecologist [facility], Department of OB/GYN [facility]

## 2016-12-31 NOTE — Telephone Encounter (Signed)
Pt [redacted] weeks pregnant. Seen in ED. For back pain. No prenatal care. She has not seen me since 2016. Will you verify/ ask if  She has established OB yet. If not I will put a referral in for her. Call her tomorrow and then please let me know. Also would you clarify with her who she considers is her pcp as I have not seen her since 2016.  She needs to see them soon and I would help expidite that. Does she have name of OB that she would like to be referred to?

## 2016-12-31 NOTE — OB Triage Note (Signed)
Patient presented to ED c/o mid back pain that started 2000 on 12/30/16. Rates pain 5/10. Denies VB, LOF. Pt states positive fetal movement. Denies any prenatal care this pregnancy. [facility] and infant is 389 months old now.

## 2017-01-01 LAB — RPR: RPR Ser Ql: NONREACTIVE

## 2017-01-01 LAB — RUBELLA SCREEN: Rubella: 1.5 index (ref >0.99)

## 2017-01-01 LAB — HEPATITIS B SURFACE ANTIGEN: Hepatitis B Surface Ag: NEGATIVE

## 2017-01-18 NOTE — Telephone Encounter (Signed)
Mailed letter °

## 2017-02-14 ENCOUNTER — Observation Stay
Admission: EM | Admit: 2017-02-14 | Discharge: 2017-02-14 | Disposition: A | Discharge status: Home / Self Care | Payer: Medicaid Other | Attending: Obstetrics and Gynecology | Admitting: Obstetrics and Gynecology

## 2017-02-14 DIAGNOSIS — O99333 Smoking (tobacco) complicating pregnancy, third trimester: Secondary | ICD-10-CM | POA: Diagnosis not present

## 2017-02-14 DIAGNOSIS — O99323 Drug use complicating pregnancy, third trimester: Secondary | ICD-10-CM | POA: Diagnosis not present

## 2017-02-14 DIAGNOSIS — N760 Acute vaginitis: Secondary | ICD-10-CM

## 2017-02-14 DIAGNOSIS — O9989 Other specified diseases and conditions complicating pregnancy, childbirth and the puerperium: Principal | ICD-10-CM | POA: Insufficient documentation

## 2017-02-14 DIAGNOSIS — F1290 Cannabis use, unspecified, uncomplicated: Secondary | ICD-10-CM | POA: Diagnosis not present

## 2017-02-14 DIAGNOSIS — O23593 Infection of other part of genital tract in pregnancy, third trimester: Secondary | ICD-10-CM

## 2017-02-14 DIAGNOSIS — Z3A28 28 weeks gestation of pregnancy: Secondary | ICD-10-CM | POA: Diagnosis not present

## 2017-02-14 DIAGNOSIS — R1032 Left lower quadrant pain: Secondary | ICD-10-CM | POA: Insufficient documentation

## 2017-02-14 DIAGNOSIS — F17290 Nicotine dependence, other tobacco product, uncomplicated: Secondary | ICD-10-CM | POA: Diagnosis not present

## 2017-02-14 DIAGNOSIS — R109 Unspecified abdominal pain: Secondary | ICD-10-CM

## 2017-02-14 DIAGNOSIS — O26899 Other specified pregnancy related conditions, unspecified trimester: Secondary | ICD-10-CM | POA: Diagnosis present

## 2017-02-14 LAB — URINALYSIS, ROUTINE W REFLEX MICROSCOPIC
BILIRUBIN URINE: NEGATIVE
GLUCOSE, UA: NEGATIVE mg/dL
Hgb urine dipstick: NEGATIVE
Ketones, ur: 20 mg/dL — AB
Leukocytes, UA: NEGATIVE
Nitrite: NEGATIVE
PH: 6 (ref 5.0–8.0)
Protein, ur: NEGATIVE mg/dL
SPECIFIC GRAVITY, URINE: 1.02 (ref 1.005–1.030)

## 2017-02-14 LAB — URINALYSIS, COMPLETE (UACMP) WITH MICROSCOPIC
BILIRUBIN URINE: NEGATIVE
Bacteria, UA: NONE SEEN
Glucose, UA: NEGATIVE mg/dL
HGB URINE DIPSTICK: NEGATIVE
KETONES UR: 20 mg/dL — AB
Nitrite: NEGATIVE
PH: 6 (ref 5.0–8.0)
Protein, ur: NEGATIVE mg/dL
Specific Gravity, Urine: 1.02 (ref 1.005–1.030)

## 2017-02-14 LAB — URINE DRUG SCREEN, QUALITATIVE (ARMC ONLY)
Amphetamines, Ur Screen: NOT DETECTED
BARBITURATES, UR SCREEN: NOT DETECTED
BENZODIAZEPINE, UR SCRN: NOT DETECTED
Cannabinoid 50 Ng, Ur ~~LOC~~: POSITIVE — AB
Cocaine Metabolite,Ur ~~LOC~~: NOT DETECTED
MDMA (Ecstasy)Ur Screen: NOT DETECTED
METHADONE SCREEN, URINE: NOT DETECTED
Opiate, Ur Screen: NOT DETECTED
Phencyclidine (PCP) Ur S: NOT DETECTED
TRICYCLIC, UR SCREEN: NOT DETECTED

## 2017-02-14 LAB — CHLAMYDIA/NGC RT PCR (ARMC ONLY)
CHLAMYDIA TR: NOT DETECTED
N gonorrhoeae: NOT DETECTED

## 2017-02-14 LAB — WET PREP, GENITAL
Sperm: NONE SEEN
TRICH WET PREP: NONE SEEN
YEAST WET PREP: NONE SEEN

## 2017-02-14 MED ORDER — LACTATED RINGERS IV SOLN: INTRAVENOUS | Status: DC

## 2017-02-14 MED ORDER — LACTATED RINGERS IV SOLN: 500.0000 mL | INTRAVENOUS | Status: DC | PRN

## 2017-02-14 NOTE — OB Triage Note (Signed)
Patient presented to L&D from the emergency dept with complaints of sharp lower (left sided) abdominal pain that started around 14:45 today. Patient states it comes and goes.  Denies vaginal bleeding, leaking of fluid or decreased fetal movement.  Recent intercourse (within 24 hours)

## 2017-02-14 NOTE — Discharge Summary (Addendum)
TRIAGE NOTE to rule out Preterm Labor   History of Present Illness: Lauren Carrillo is a 26 y.o. G2P1001 at 6983w2d presenting to triage for preterm pain.  At 245 this afternoon, patient began having left lower quadrant pain. She was sitting at the time. Walking makes it better. No movement makes it worse. No dysuria, no vaginal discharge, no vaginal bleeding. Normal fetal movement. No fever. She did have sex last night in the last 24 hours. Normal term delivery with her last child 11 months ago today. No history of pyelonephritis. She is not have pain on the right side.  Patient Active Problem List   Diagnosis Date Noted  . Abdominal pain in pregnancy 02/14/2017  . Labor and delivery indication for care or intervention 12/31/2016  . Acute bronchitis 01/24/2015  . Costochondritis 01/24/2015  . Smoker 01/24/2015    Past Medical History:  Diagnosis Date  . History of hiatal hernia     Past Surgical History:  Procedure Laterality Date  . NO PAST SURGERIES    . WISDOM TOOTH EXTRACTION      OB History  Gravida Para Term Preterm AB Living  2 1 1     1   SAB TAB Ectopic Multiple Live Births          1    # Outcome Date GA Lbr Len/2nd Weight Sex Delivery Anes PTL Lv  2 Current           1 Term 03/17/16    F Vag-Spont   LIV      Social History   Social History  . Marital status: Single    Spouse name: N/A  . Number of children: N/A  . Years of education: N/A   Social History Main Topics  . Smoking status: Current Every Day Smoker    Packs/day: 1.00    Years: 4.00    Types: Cigars  . Smokeless tobacco: Never Used  . Alcohol use Yes     Comment: rare 1-2 times a year.  . Drug use: Yes    Frequency: 7.0 times per week    Types: Marijuana     Comment: every day  . Sexual activity: Yes    Birth control/ protection: None     Comment: Doesn't believe in it   Other Topics Concern  . None   Social History Narrative  . None    Family History  Problem Relation Age  of Onset  . Cancer Father        lung    No Known Allergies  Prescriptions Prior to Admission  Medication Sig Dispense Refill Last Dose  . azithromycin (ZITHROMAX) 250 MG tablet Take 250 mg by mouth daily.   Not Taking at Unknown time  . benzonatate (TESSALON) 100 MG capsule Take 1 capsule (100 mg total) by mouth 3 (three) times daily as needed. (Patient not taking: Reported on 12/31/2016) 21 capsule 0 Not Taking at Unknown time  . fluticasone (FLONASE) 50 MCG/ACT nasal spray Place 2 sprays into both nostrils daily. (Patient not taking: Reported on 12/31/2016) 16 g 1 Not Taking at Unknown time    Review of Systems - See HPI for OB specific ROS.   Vitals:  BP (!) 116/50 (BP Location: Right Arm)   Pulse 72   Temp 98 F (36.7 C) (Oral)   Resp 18   Ht 5\' 1"  (1.549 m)   Wt 168 lb (76.2 kg)   LMP 07/31/2016 (Exact Date)   BMI 31.74 kg/m  Physical Examination: CONSTITUTIONAL: Well-developed, well-nourished female in no acute distress.  HENT:  Normocephalic, atraumatic EYES: Conjunctivae and EOM are normal. No scleral icterus.  NECK: Normal range of motion, supple, SKIN: Skin is warm and dry. No rash noted. Not diaphoretic. No erythema. No pallor. NEUROLGIC: Alert and oriented to person, place, and time. No gross cranial nerve deficit noted. PSYCHIATRIC: Normal mood and affect. Normal behavior. Normal judgment and thought content. CARDIOVASCULAR: Normal heart rate noted, regular rhythm RESPIRATORY: Effort and breath sounds normal, no problems with respiration noted ABDOMEN: Soft, nontender, nondistended, gravid.  Cervix: closed/thick/high Abdomen: Non tender, not worsening with palpation Back: No CVA tenderness Membranes:intact Fetal Monitoring:Baseline: 150 bpm, Variability: Good {> 6 bpm), Accelerations: Non-reactive but appropriate for gestational age and Decelerations: Absent Tocometer: Flat  Labs:  Results for orders placed or performed during the hospital encounter of  02/14/17 (from the past 24 hour(s))  Urinalysis, Complete w Microscopic   Collection Time: 02/14/17  6:32 PM  Result Value Ref Range   Color, Urine YELLOW (A) YELLOW   APPearance HAZY (A) CLEAR   Specific Gravity, Urine 1.020 1.005 - 1.030   pH 6.0 5.0 - 8.0   Glucose, UA NEGATIVE NEGATIVE mg/dL   Hgb urine dipstick NEGATIVE NEGATIVE   Bilirubin Urine NEGATIVE NEGATIVE   Ketones, ur 20 (A) NEGATIVE mg/dL   Protein, ur NEGATIVE NEGATIVE mg/dL   Nitrite NEGATIVE NEGATIVE   Leukocytes, UA TRACE (A) NEGATIVE   RBC / HPF 0-5 0 - 5 RBC/hpf   WBC, UA 6-30 0 - 5 WBC/hpf   Bacteria, UA NONE SEEN NONE SEEN   Squamous Epithelial / LPF 6-30 (A) NONE SEEN   Mucous PRESENT     Imaging Studies: No results found.   Assessment and Plan: Patient Active Problem List   Diagnosis Date Noted  . Abdominal pain in pregnancy 02/14/2017  . Labor and delivery indication for care or intervention 12/31/2016  . Acute bronchitis 01/24/2015  . Costochondritis 01/24/2015  . Smoker 01/24/2015    1. Preterm pain: Labor is not present. Likely musculoskeletal or round ligament. Wet mount positive for clue cells, and GC chlamydia pending. UA negative for bacteria, leukocytes, WBCs. Will treat with Flagyl- written script given. Home with conservative measures. Follow-up in 3 days at her OB/GYN as planned.   Cline Cools, MD, MPH

## 2017-02-14 NOTE — Discharge Instructions (Signed)
Take flagyl as prescribed!

## 2017-05-05 ENCOUNTER — Inpatient Hospital Stay: Payer: Medicaid Other | Admitting: Registered Nurse

## 2017-05-05 ENCOUNTER — Inpatient Hospital Stay
Admission: EM | Admit: 2017-05-05 | Discharge: 2017-05-07 | DRG: 806 | Disposition: A | Discharge status: Home / Self Care | Payer: Medicaid Other | Attending: Obstetrics and Gynecology | Admitting: Obstetrics and Gynecology

## 2017-05-05 DIAGNOSIS — O99324 Drug use complicating childbirth: Secondary | ICD-10-CM | POA: Diagnosis present

## 2017-05-05 DIAGNOSIS — O9081 Anemia of the puerperium: Secondary | ICD-10-CM | POA: Diagnosis not present

## 2017-05-05 DIAGNOSIS — F1290 Cannabis use, unspecified, uncomplicated: Secondary | ICD-10-CM | POA: Diagnosis present

## 2017-05-05 DIAGNOSIS — D62 Acute posthemorrhagic anemia: Secondary | ICD-10-CM | POA: Diagnosis not present

## 2017-05-05 DIAGNOSIS — O0973 Supervision of high risk pregnancy due to social problems, third trimester: Secondary | ICD-10-CM

## 2017-05-05 DIAGNOSIS — Z3A39 39 weeks gestation of pregnancy: Secondary | ICD-10-CM | POA: Diagnosis not present

## 2017-05-05 DIAGNOSIS — O26893 Other specified pregnancy related conditions, third trimester: Secondary | ICD-10-CM | POA: Diagnosis present

## 2017-05-05 DIAGNOSIS — O99334 Smoking (tobacco) complicating childbirth: Secondary | ICD-10-CM | POA: Diagnosis present

## 2017-05-05 DIAGNOSIS — F17290 Nicotine dependence, other tobacco product, uncomplicated: Secondary | ICD-10-CM | POA: Diagnosis present

## 2017-05-05 DIAGNOSIS — Z8759 Personal history of other complications of pregnancy, childbirth and the puerperium: Secondary | ICD-10-CM

## 2017-05-05 DIAGNOSIS — R001 Bradycardia, unspecified: Secondary | ICD-10-CM | POA: Diagnosis present

## 2017-05-05 LAB — CBC
HEMATOCRIT: 42.1 % (ref 35.0–47.0)
HEMOGLOBIN: 13.9 g/dL (ref 12.0–16.0)
MCH: 29 pg (ref 26.0–34.0)
MCHC: 32.9 g/dL (ref 32.0–36.0)
MCV: 87.9 fL (ref 80.0–100.0)
Platelets: 306 10*3/uL (ref 150–440)
RBC: 4.79 MIL/uL (ref 3.80–5.20)
RDW: 13.9 % (ref 11.5–14.5)
WBC: 18.6 10*3/uL — AB (ref 3.6–11.0)

## 2017-05-05 LAB — URINE DRUG SCREEN, QUALITATIVE (ARMC ONLY)
AMPHETAMINES, UR SCREEN: NOT DETECTED
BENZODIAZEPINE, UR SCRN: NOT DETECTED
Barbiturates, Ur Screen: NOT DETECTED
Cannabinoid 50 Ng, Ur ~~LOC~~: POSITIVE — AB
Cocaine Metabolite,Ur ~~LOC~~: NOT DETECTED
MDMA (Ecstasy)Ur Screen: NOT DETECTED
METHADONE SCREEN, URINE: NOT DETECTED
Opiate, Ur Screen: NOT DETECTED
Phencyclidine (PCP) Ur S: NOT DETECTED
Tricyclic, Ur Screen: NOT DETECTED

## 2017-05-05 LAB — TYPE AND SCREEN
ABO/RH(D): O POS
ANTIBODY SCREEN: NEGATIVE

## 2017-05-05 LAB — RAPID HIV SCREEN (HIV 1/2 AB+AG)
HIV 1/2 Antibodies: NONREACTIVE
HIV-1 P24 ANTIGEN - HIV24: NONREACTIVE

## 2017-05-05 MED ORDER — FLEET ENEMA 7-19 GM/118ML RE ENEM: 1.0000 | ENEMA | Freq: Every day | RECTAL | Status: DC | PRN

## 2017-05-05 MED ORDER — SENNOSIDES-DOCUSATE SODIUM 8.6-50 MG PO TABS: 2.0000 | ORAL_TABLET | ORAL | Status: DC

## 2017-05-05 MED ORDER — SODIUM CHLORIDE 0.9% FLUSH: 3.0000 mL | INTRAVENOUS | Status: DC | PRN

## 2017-05-05 MED ORDER — ONDANSETRON HCL 4 MG/2ML IJ SOLN: 4.0000 mg | Freq: Four times a day (QID) | INTRAMUSCULAR | Status: DC | PRN

## 2017-05-05 MED ORDER — LACTATED RINGERS IV BOLUS (SEPSIS)
250.0000 mL | Freq: Once | INTRAVENOUS | Status: AC
  Administered 2017-05-05: 250 mL via INTRAVENOUS

## 2017-05-05 MED ORDER — BUPIVACAINE HCL (PF) 0.25 % IJ SOLN
INTRAMUSCULAR | Status: DC | PRN
  Administered 2017-05-05 (×2): 4 mL via EPIDURAL

## 2017-05-05 MED ORDER — LIDOCAINE HCL (PF) 1 % IJ SOLN: 30.0000 mL | INTRAMUSCULAR | Status: DC | PRN

## 2017-05-05 MED ORDER — LACTATED RINGERS IV SOLN
500.0000 mL | INTRAVENOUS | Status: DC | PRN
  Administered 2017-05-05: 250 mL via INTRAVENOUS
  Administered 2017-05-05: 500 mL via INTRAVENOUS

## 2017-05-05 MED ORDER — SODIUM CHLORIDE 0.9 % IV SOLN: 2.0000 g | Freq: Once | INTRAVENOUS | Status: DC

## 2017-05-05 MED ORDER — DIPHENHYDRAMINE HCL 25 MG PO CAPS: 25.0000 mg | ORAL_CAPSULE | Freq: Four times a day (QID) | ORAL | Status: DC | PRN

## 2017-05-05 MED ORDER — ACETAMINOPHEN 325 MG PO TABS: 650.0000 mg | ORAL_TABLET | ORAL | Status: DC | PRN

## 2017-05-05 MED ORDER — EPHEDRINE 5 MG/ML INJ
INTRAVENOUS | Status: AC
  Filled 2017-05-05: qty 4

## 2017-05-05 MED ORDER — OXYTOCIN BOLUS FROM INFUSION
500.0000 mL | Freq: Once | INTRAVENOUS | Status: AC
  Administered 2017-05-05: 500 mL via INTRAVENOUS

## 2017-05-05 MED ORDER — FENTANYL 2.5 MCG/ML W/ROPIVACAINE 0.15% IN NS 100 ML EPIDURAL (ARMC)
EPIDURAL | Status: AC
  Administered 2017-05-05: 250 ug
  Filled 2017-05-05: qty 100

## 2017-05-05 MED ORDER — EPHEDRINE 5 MG/ML INJ
20.0000 mg | Freq: Once | INTRAVENOUS | Status: AC
  Administered 2017-05-05: 20 mg via INTRAVENOUS

## 2017-05-05 MED ORDER — OXYCODONE HCL 5 MG PO TABS: 5.0000 mg | ORAL_TABLET | ORAL | Status: DC | PRN

## 2017-05-05 MED ORDER — EPHEDRINE 5 MG/ML INJ
10.0000 mg | INTRAVENOUS | Status: DC | PRN
  Filled 2017-05-05: qty 2
  Filled 2017-05-05: qty 4

## 2017-05-05 MED ORDER — BUTORPHANOL TARTRATE 1 MG/ML IJ SOLN: 1.0000 mg | INTRAMUSCULAR | Status: DC | PRN

## 2017-05-05 MED ORDER — PHENYLEPHRINE 40 MCG/ML (10ML) SYRINGE FOR IV PUSH (FOR BLOOD PRESSURE SUPPORT)
80.0000 ug | PREFILLED_SYRINGE | INTRAVENOUS | Status: DC | PRN
  Filled 2017-05-05: qty 5

## 2017-05-05 MED ORDER — ONDANSETRON HCL 4 MG PO TABS: 4.0000 mg | ORAL_TABLET | ORAL | Status: DC | PRN

## 2017-05-05 MED ORDER — SOD CITRATE-CITRIC ACID 500-334 MG/5ML PO SOLN: 30.0000 mL | ORAL | Status: DC | PRN

## 2017-05-05 MED ORDER — ZOLPIDEM TARTRATE 5 MG PO TABS: 5.0000 mg | ORAL_TABLET | Freq: Every evening | ORAL | Status: DC | PRN

## 2017-05-05 MED ORDER — LACTATED RINGERS IV SOLN: 500.0000 mL | Freq: Once | INTRAVENOUS | Status: DC

## 2017-05-05 MED ORDER — MEASLES, MUMPS & RUBELLA VAC ~~LOC~~ INJ
0.5000 mL | INJECTION | Freq: Once | SUBCUTANEOUS | Status: DC
  Filled 2017-05-05: qty 0.5

## 2017-05-05 MED ORDER — DIBUCAINE 1 % RE OINT: 1.0000 "application " | TOPICAL_OINTMENT | RECTAL | Status: DC | PRN

## 2017-05-05 MED ORDER — EPHEDRINE 5 MG/ML INJ
10.0000 mg | INTRAVENOUS | Status: DC | PRN
  Administered 2017-05-05: 10 mg via INTRAVENOUS
  Filled 2017-05-05: qty 2

## 2017-05-05 MED ORDER — WITCH HAZEL-GLYCERIN EX PADS: 1.0000 "application " | MEDICATED_PAD | CUTANEOUS | Status: DC | PRN

## 2017-05-05 MED ORDER — FENTANYL 2.5 MCG/ML W/ROPIVACAINE 0.15% IN NS 100 ML EPIDURAL (ARMC)
EPIDURAL | Status: DC | PRN
  Administered 2017-05-05: 12 mL/h via EPIDURAL

## 2017-05-05 MED ORDER — ONDANSETRON HCL 4 MG/2ML IJ SOLN: 4.0000 mg | INTRAMUSCULAR | Status: DC | PRN

## 2017-05-05 MED ORDER — LIDOCAINE HCL (PF) 1 % IJ SOLN
INTRAMUSCULAR | Status: DC | PRN
  Administered 2017-05-05: 3 mL via SUBCUTANEOUS

## 2017-05-05 MED ORDER — TETANUS-DIPHTH-ACELL PERTUSSIS 5-2.5-18.5 LF-MCG/0.5 IM SUSP: 0.5000 mL | Freq: Once | INTRAMUSCULAR | Status: DC

## 2017-05-05 MED ORDER — COCONUT OIL OIL: 1.0000 "application " | TOPICAL_OIL | Status: DC | PRN

## 2017-05-05 MED ORDER — LACTATED RINGERS IV SOLN
INTRAVENOUS | Status: DC
  Administered 2017-05-05: 11:00:00 via INTRAVENOUS
  Administered 2017-05-05: 1000 mL via INTRAVENOUS

## 2017-05-05 MED ORDER — SODIUM CHLORIDE 0.9 % IV SOLN: 250.0000 mL | INTRAVENOUS | Status: DC | PRN

## 2017-05-05 MED ORDER — SODIUM CHLORIDE 0.9% FLUSH: 3.0000 mL | Freq: Two times a day (BID) | INTRAVENOUS | Status: DC

## 2017-05-05 MED ORDER — LIDOCAINE-EPINEPHRINE (PF) 1.5 %-1:200000 IJ SOLN
INTRAMUSCULAR | Status: DC | PRN
  Administered 2017-05-05: 3 mL via EPIDURAL

## 2017-05-05 MED ORDER — BENZOCAINE-MENTHOL 20-0.5 % EX AERO
1.0000 "application " | INHALATION_SPRAY | CUTANEOUS | Status: DC | PRN
  Administered 2017-05-05: 1 via TOPICAL
  Filled 2017-05-05: qty 56

## 2017-05-05 MED ORDER — DIPHENHYDRAMINE HCL 50 MG/ML IJ SOLN
12.5000 mg | INTRAMUSCULAR | Status: DC | PRN
  Administered 2017-05-05: 12.5 mg via INTRAVENOUS
  Filled 2017-05-05: qty 1

## 2017-05-05 MED ORDER — OXYTOCIN 40 UNITS IN LACTATED RINGERS INFUSION - SIMPLE MED
2.5000 [IU]/h | INTRAVENOUS | Status: DC
  Administered 2017-05-05: 2.5 [IU]/h via INTRAVENOUS
  Filled 2017-05-05: qty 1000

## 2017-05-05 MED ORDER — SIMETHICONE 80 MG PO CHEW
80.0000 mg | CHEWABLE_TABLET | ORAL | Status: DC | PRN
  Filled 2017-05-05: qty 1

## 2017-05-05 MED ORDER — FENTANYL 2.5 MCG/ML W/ROPIVACAINE 0.15% IN NS 100 ML EPIDURAL (ARMC): 12.0000 mL/h | EPIDURAL | Status: DC

## 2017-05-05 MED ORDER — PRENATAL MULTIVITAMIN CH
1.0000 | ORAL_TABLET | Freq: Every day | ORAL | Status: DC
  Administered 2017-05-06 – 2017-05-07 (×2): 1 via ORAL
  Filled 2017-05-05 (×2): qty 1

## 2017-05-05 MED ORDER — IBUPROFEN 600 MG PO TABS
600.0000 mg | ORAL_TABLET | Freq: Four times a day (QID) | ORAL | Status: DC
  Administered 2017-05-05 – 2017-05-07 (×9): 600 mg via ORAL
  Filled 2017-05-05 (×9): qty 1

## 2017-05-05 MED ORDER — BISACODYL 10 MG RE SUPP
10.0000 mg | Freq: Every day | RECTAL | Status: DC | PRN
  Filled 2017-05-05: qty 1

## 2017-05-05 NOTE — Progress Notes (Signed)
Lauren Carrillo is a 26 y.o. G2P1001 at 324w5d by LMP admitted for active labor  Subjective: Comfortable with epidural  Objective: BP (!) 113/53   Pulse 67   Temp 97.6 F (36.4 C) (Oral)   Resp 18   Ht 5\' 1"  (1.549 m)   Wt 76.2 kg (168 lb) Comment: pt does not know current weight  LMP 07/31/2016 (Exact Date)   SpO2 99%   BMI 31.74 kg/m  No intake/output data recorded. Total I/O In: 1207 [I.V.:1207] Out: 150 [Emesis/NG output:100; Blood:50]  FHT:  FHR: 165 bpm, variability: moderate,  accelerations:  Present,  decelerations:  Present variables UC:   regular, every 3 minutes SVE:   Dilation: 8 Effacement (%): 100 Station: 0 Exam by:: Shayne AlkenL Neese, RN  Labs: Lab Results  Component Value Date   WBC 18.6 (H) 05/05/2017   HGB 13.9 05/05/2017   HCT 42.1 05/05/2017   MCV 87.9 05/05/2017   PLT 306 05/05/2017    Assessment / Plan:  - AROM for thick meconium - tachycardic, but with mod var, and accels. Will continuously monitor - anticipate vaginal delivery  Lauren Carrillo 05/05/2017, 2:08 PM

## 2017-05-05 NOTE — H&P (Signed)
OB ADMISSION/ HISTORY & PHYSICAL:  Admission Date: 05/05/2017  9:29 AM  Admit Diagnosis: labor at term  Lauren Carrillo is a 26 y.o. female presenting for active labor.  Good fetal movement, no LOF or VB.  Prenatal History: G2P1001   EDC : 05/07/2017, by Last Menstrual Period  [facility] P[facility]atal course complicated by insufficient care. Last visit at 28wks. No screening diabetes. Hx of GBS, but no screening this pregnancy  Prenatal Labs: ABO, Rh: --/--/O POS (06/22 0123) Antibody: NEG (06/22 0123) Rubella: 1.50 (06/22 0123)  RPR: Non Reactive (06/22 0123)  HBsAg: Negative (06/22 0123)  HIV:    GTT: unknown GBS:   unknown  Medical / Surgical History :  Past medical history:  Past Medical History:  Diagnosis Date  . History of hiatal hernia      Past surgical history:  Past Surgical History:  Procedure Laterality Date  . NO PAST SURGERIES    . WISDOM TOOTH EXTRACTION      Family History:  Family History  Problem Relation Age of Onset  . Cancer Father        lung     Social History:  reports that she has been smoking Cigars.  She has a 4.00 pack-year smoking history. She has never used smokeless tobacco. She reports that she drinks alcohol. She reports that she uses drugs, including Marijuana, about 7 times per week.   Allergies: Patient has no known allergies.    Current Medications at time of admission:  Prior to Admission medications   Medication Sig Start Date End Date Taking? Authorizing Provider  azithromycin (ZITHROMAX) 250 MG tablet Take 250 mg by mouth daily.    [provider]  benzonatate (TESSALON) 100 MG capsule Take 1 capsule (100 mg total) by mouth 3 (three) times daily as needed. Patient not taking: Reported on 12/31/2016 01/24/15   Saguier, Ramon DredgeEdward, PA-C  fluticasone [facility](FLONASE) 50 MCG/ACT nasal spray Place 2 sprays into both nostrils daily. Patient not taking: Reported on 12/31/2016 01/24/15   Saguier, Ramon DredgeEdward, PA-C      Review of Systems: Active FM onset of ctx @ 0600 currently every 3-4 minutes   Physical Exam:  VS: Blood pressure 111/60, pulse 60, temperature 97.8 F (36.6 C), temperature source Oral, resp. rate 16, last menstrual period 07/31/2016.  General: alert and oriented, appears mod distress Heart: RRR Lungs: Clear lung fields Abdomen: Gravid, soft and non-tender, non-distended / uterus: 7.5 # Extremities: trace edema  Genitalia / VE: Dilation: 5 Effacement (%): 90 Station: 0 Exam by:: Dalbert GarnetBeasley MD  FHR: baseline rate 130 / variability mod / accelerations + / no decelerations TOCO: q2-3  Last US U/S @ 24+ WKS: + ACTIVITY, CX 3.6 CM LONG & CLOSED, GR 1 ANT HIGH PLAC,  FLUID WNL, MEAS C/W LMP DATES, NO ABNL NOTED - CLB  Assessment: 39+[redacted] weeks gestation 1 stage of labor FHR category 1   Plan:  Admit for active labor Labs pending Epidural when desired Continuous fetal monitoring   1. Fetal Well being  - Fetal Tracing: Cat i - Ultrasound:  reviewed, as above - Group B Streptococcus: unknown: will not treat for term unknown, no prior - Presentation: vtx confirmed by sutures   2. Routine OB: - Prenatal labs reviewed, as above - Rh O pos    -  Contractions external toco in place -  Pelvis proven to 5#

## 2017-05-05 NOTE — Anesthesia Procedure Notes (Signed)
Epidural Patient location during procedure: OB Start time: 05/05/2017 11:10 AM End time: 05/05/2017 11:25 AM  Staffing Anesthesiologist: Yves DillARROLL, PAUL Resident/CRNA: Karoline CaldwellSTARR, Sophiana Milanese Performed: resident/CRNA   Preanesthetic Checklist Completed: patient identified, site marked, surgical consent, pre-op evaluation, timeout performed, IV checked, risks and benefits discussed and monitors and equipment checked  Epidural Patient position: sitting Prep: Betadine Patient monitoring: heart rate, continuous pulse ox and blood pressure Approach: midline Location: L4-L5 Injection technique: LOR saline  Needle:  Needle type: Tuohy  Needle gauge: 17 G Needle length: 9 cm and 9 Needle insertion depth: 6 cm Catheter type: closed end flexible Catheter size: 19 Gauge Catheter at skin depth: 11 cm Test dose: negative and 1.5% lidocaine with Epi 1:200 K  Assessment Events: blood not aspirated, injection not painful, no injection resistance, negative IV test and no paresthesia  Additional Notes Pt. Evaluated and documentation done after procedure finished. Patient identified. Risks/Benefits/Options discussed with patient including but not limited to bleeding, infection, nerve damage, paralysis, failed block, incomplete pain control, headache, blood pressure changes, nausea, vomiting, reactions to medication both or allergic, itching and postpartum back pain. Confirmed with bedside nurse the patient's most recent platelet count. Confirmed with patient that they are not currently taking any anticoagulation, have any bleeding history or any family history of bleeding disorders. Patient expressed understanding and wished to proceed. All questions were answered. Sterile technique was used throughout the entire procedure. Please see nursing notes for vital signs. Test dose was given through epidural catheter and negative prior to continuing to dose epidural or start infusion. Warning signs of high block given  to the patient including shortness of breath, tingling/numbness in hands, complete motor block, or any concerning symptoms with instructions to call for help. Patient was given instructions on fall risk and not to get out of bed. All questions and concerns addressed with instructions to call with any issues or inadequate analgesia.   Patient tolerated the insertion well without immediate complications.Reason for block:procedure for pain

## 2017-05-05 NOTE — Anesthesia Preprocedure Evaluation (Signed)
Anesthesia Evaluation  Patient identified by MRN, date of birth, ID band Patient awake    Reviewed: Allergy & Precautions, H&P , NPO status , Patient's Chart, lab work & pertinent test results  Airway Mallampati: III  TM Distance: >3 FB Neck ROM: full    Dental  (+) Chipped   Pulmonary Current Smoker,           Cardiovascular      Neuro/Psych    GI/Hepatic   Endo/Other    Renal/GU      Musculoskeletal   Abdominal   Peds  Hematology   Anesthesia Other Findings   Reproductive/Obstetrics (+) Pregnancy                             Anesthesia Physical Anesthesia Plan  ASA: II  Anesthesia Plan: Epidural   Post-op Pain Management:    Induction:   PONV Risk Score and Plan:   Airway Management Planned:   Additional Equipment:   Intra-op Plan:   Post-operative Plan:   Informed Consent: I have reviewed the patients History and Physical, chart, labs and discussed the procedure including the risks, benefits and alternatives for the proposed anesthesia with the patient or authorized representative who has indicated his/her understanding and acceptance.     Plan Discussed with: Anesthesiologist and CRNA  Anesthesia Plan Comments:         Anesthesia Quick Evaluation

## 2017-05-05 NOTE — Discharge Summary (Signed)
Obstetrical Discharge Summary  Patient Name: Lauren Carrillo DOB: 1991/05/18 MRN: [id number]  Date of Admission: 05/05/2017 Date of Discharge: 05/07/2017  Primary OB: in [place]  Gestational Age at Delivery: 2743w5d   Antepartum complications:  - insufficient prenatal care - THC use in pregnancy - positive on UDS on admission - unknown GBS status - short interpregnancy interval  Admitting Diagnosis: active labor Secondary Diagnosis: Patient Active Problem List   Diagnosis Date Noted  . Bradycardia 05/07/2017  . Suprvsn of high risk preg due to social problems, third tri 05/05/2017  . Abdominal pain in pregnancy 02/14/2017  . Labor and delivery indication for care or intervention 12/31/2016  . Acute bronchitis 01/24/2015  . Costochondritis 01/24/2015  . Smoker 01/24/2015    Augmentation: AROM Complications: None Intrapartum complications/course:   At 3:42 PM a viable and healthy female "Barbie BannerOrevaoghene Jr" was delivered via Vaginal, Vacuum Investment banker, operational(Extractor).  Presentation: vertex; Position: Right,, Occiput,, Anterior; Station: +4.  FHT in 60s x3 min with excellent maternal effort. Vacuum applied x1 push, no pop-offs   Date of Delivery: 05/05/17 Delivered By: Christeen DouglasBethany Beasley  Delivery Type: vacuum, outlet Anesthesia: epidural Placenta: Spontaneous Laceration: bilateral labial Episiotomy: none Newborn Data: Live born female "Orevaoghene Jr" - Baby's name is pronounced: "or-AY-vagany (like mahogany)"  Birth Weight: 6 lb 11.6 oz (3050 g) APGAR: 9, 9  Newborn Delivery   Birth date/time:  05/05/2017 15:42:00 Delivery type:  Vaginal, Vacuum (Extractor)        Discharge Physical Exam:  BP 120/71 (BP Location: Right Arm)   Pulse (!) 53   Temp 98 F (36.7 C)   Resp 18   Ht 5\' 1"  (1.549 m)   Wt 76.2 kg (168 lb) Comment: pt does not know current weight  LMP 07/31/2016 (Exact Date)   SpO2 97%   BMI 31.74 kg/m   General: NAD CV: RRR Pulm: CTABL, nl effort ABD: s/nd/nt,  fundus firm and below the umbilicus Lochia: moderate DVT Evaluation: LE non-ttp, no evidence of DVT on exam.  Hemoglobin  Date Value Ref Range Status  05/07/2017 11.9 (L) 12.0 - 16.0 g/dL Final   HCT  Date Value Ref Range Status  05/07/2017 36.2 35.0 - 47.0 % Final    Post partum course: routine, baby under bililights Postpartum Procedures: none Disposition: stable, discharge to home. Baby Feeding: formula Baby Disposition: home with mom  Contraception: None- condoms and NFP  Prenatal Labs:  pending   Plan:  Lauren Carrillo was discharged to home in good condition. [facility] with Dr. Dalbert GarnetBeasley in 6 weeks   Discharge Medications: Allergies as of 05/07/2017   No Known Allergies     Medication List    TAKE these medications   azithromycin 250 MG tablet Commonly known as:  ZITHROMAX Take 250 mg by mouth daily.   benzonatate 100 MG capsule Commonly known as:  TESSALON Take 1 capsule (100 mg total) by mouth 3 (three) times daily as needed.   fluticasone 50 MCG/ACT nasal spray Commonly known as:  FLONASE Place 2 sprays into both nostrils daily.   ibuprofen 600 MG tablet Commonly known as:  ADVIL,MOTRIN Take 1 tablet (600 mg total) by mouth every 6 (six) hours.   multivitamin-prenatal 27-0.8 MG Tabs tablet Take 1 tablet by mouth daily at 12 noon.       Follow-up Information    Christeen DouglasBeasley, Bethany, MD Follow up in 6 week(s).   Specialty:  Obstetrics and Gynecology Contact information: [address] [place]  [address] [phone]           Signed: ----- Ranae Plumber, MD Attending Obstetrician and Gynecologist [facility], Department of OB/GYN [facility]

## 2017-05-06 LAB — CBC
HEMATOCRIT: 33.9 % — AB (ref 35.0–47.0)
HEMOGLOBIN: 11.4 g/dL — AB (ref 12.0–16.0)
MCH: 29.7 pg (ref 26.0–34.0)
MCHC: 33.8 g/dL (ref 32.0–36.0)
MCV: 87.8 fL (ref 80.0–100.0)
Platelets: 250 10*3/uL (ref 150–440)
RBC: 3.86 MIL/uL (ref 3.80–5.20)
RDW: 14.1 % (ref 11.5–14.5)
WBC: 18.7 10*3/uL — AB (ref 3.6–11.0)

## 2017-05-06 LAB — RPR: RPR: NONREACTIVE

## 2017-05-06 NOTE — Progress Notes (Signed)
Post Partum Day 1 Subjective: no complaints  Objective: Blood pressure 117/60, pulse (!) 58, temperature 98.5 F (36.9 C), temperature source Oral, resp. rate 17, height 5\' 1"  (1.549 m), weight 76.2 kg (168 lb), last menstrual period 07/31/2016, SpO2 99 %, unknown if currently breastfeeding.  Physical Exam:  General: alert, cooperative and appears stated age Lochia: appropriate Uterine Fundus: firm DVT Evaluation: No evidence of DVT seen on physical exam.   Recent Labs  05/05/17 0946 05/06/17 0521  HGB 13.9 11.4*  HCT 42.1 33.9*    Assessment/Plan:  - bottle feeding - leukocytosis, non-specific. Present on admission. No evidence infection currently. Anemia: acute blood loss: po iron     LOS: 1 day   Christeen DouglasBethany Zayneb Baucum 05/06/2017, 9:02 AM

## 2017-05-06 NOTE — Clinical Social Work Maternal (Signed)
  CLINICAL SOCIAL WORK MATERNAL/CHILD NOTE  Patient Details  Name: Lauren Carrillo MRN: [id number] Date of Birth: 1991-06-16  Date:  05/06/2017  Clinical Social Worker Initiating Note:  York SpanielMonica Durene Dodge MSW,LCSW Date/Time: Initiated:  05/06/17/      Child's Name:      Biological Parents:  Mother   Need for Interpreter:  None   Reason for Referral:  Current Substance Use/Substance Use During Pregnancy    Address:  [address] [place] [address]    Phone number:  [phone] (home)     Additional phone number: none  Household Members/Support Persons (HM/SP):       HM/SP Name Relationship DOB or Age  HM/SP -1        HM/SP -2        HM/SP -3        HM/SP -4        HM/SP -5        HM/SP -6        HM/SP -7        HM/SP -8          Natural Supports (not living in the home):  Spouse/significant other   Professional Supports: None   Employment: Unemployed   Type of Work:     Education:      Homebound arranged:    Architectinancial Resources:  Medicaid   Other Resources:  [employer]IC, Sales executiveood Stamps    Cultural/Religious Considerations Which May Impact Care:  none  Strengths:  Ability to meet basic needs , Home prepared for child    Psychotropic Medications:         Pediatrician:       [place]:   [place]    [place]    [place]    [place]    [place]    [place]      Pediatrician Fax Number:    Risk Factors/Current Problems:  Substance Use    Cognitive State:  Alert , Able to Concentrate    Mood/Affect:  Calm    CSW Assessment: CSW consulted due to drug use by patient and subsequent newborn testing positive. CSW spoke with patient and her significant other in the room today. They state that they have all necessities for their newborn. They have no concerns regarding transportation. Both are unemployed but the father of baby has two job interviews pending. Until then, they state that their families can  assist with their financial needs. They are on food stamps and WIC. Patient denies any mental illness but admits to marijuana use daily. She has no intentions of quitting and states that DSS CPS was involved with their first baby. She states she uses it for relaxation. CSW asked if she had ever spoken to a mental health professional about her anxiety and she stated she had not. She states she is against taking medication in general because she doesn't like the way it makes her feel. She stated that DSS sent them to a class but they stated it really told them nothing. CSW informed patient that DSS CPS report would be made.   CSW Plan/Description:  Child Protective Service Report     York SpanielMonica Larhonda Dettloff, LCSW 05/06/2017, 11:28 AM

## 2017-05-06 NOTE — Anesthesia Postprocedure Evaluation (Signed)
Anesthesia Post Note  Patient: Lauren Carrillo  Procedure(s) Performed: AN AD HOC LABOR EPIDURAL  Patient location during evaluation: Mother Baby Anesthesia Type: Epidural Level of consciousness: awake and alert Pain management: pain level controlled Vital Signs Assessment: post-procedure vital signs reviewed and stable Respiratory status: spontaneous breathing, nonlabored ventilation and respiratory function stable Cardiovascular status: stable Postop Assessment: no headache, no backache and epidural receding Anesthetic complications: no     Last Vitals:  Vitals:   05/05/17 2355 05/06/17 0410  BP: 122/66 (!) 106/45  Pulse: (!) 55 (!) 55  Resp: 20 18  Temp: 36.9 C 36.9 C  SpO2: 98% 98%    Last Pain:  Vitals:   05/06/17 0715  TempSrc:   PainSc: 1                  Starling Mannsurtis,  Shemaiah Round A

## 2017-05-07 DIAGNOSIS — R001 Bradycardia, unspecified: Secondary | ICD-10-CM | POA: Diagnosis present

## 2017-05-07 LAB — CBC
HEMATOCRIT: 36.2 % (ref 35.0–47.0)
HEMOGLOBIN: 11.9 g/dL — AB (ref 12.0–16.0)
MCH: 28.9 pg (ref 26.0–34.0)
MCHC: 33 g/dL (ref 32.0–36.0)
MCV: 87.6 fL (ref 80.0–100.0)
Platelets: 301 10*3/uL (ref 150–440)
RBC: 4.13 MIL/uL (ref 3.80–5.20)
RDW: 14.1 % (ref 11.5–14.5)
WBC: 12.4 10*3/uL — AB (ref 3.6–11.0)

## 2017-05-07 MED ORDER — IBUPROFEN 600 MG PO TABS: 600.0000 mg | ORAL_TABLET | Freq: Four times a day (QID) | ORAL | 0 refills | Status: DC

## 2017-05-07 NOTE — Progress Notes (Signed)
D/C instructions provided, pt states understanding, aware of follow up appt.  Prescription given to pt.   

## 2017-05-07 NOTE — Discharge Instructions (Signed)

## 2017-05-10 ENCOUNTER — Telehealth: Payer: Self-pay

## 2017-12-16 ENCOUNTER — Encounter: Payer: Self-pay | Admitting: Emergency Medicine

## 2017-12-16 ENCOUNTER — Emergency Department
Admission: EM | Admit: 2017-12-16 | Discharge: 2017-12-16 | Disposition: A | Discharge status: Home / Self Care | Payer: Medicaid Other | Attending: Emergency Medicine | Admitting: Emergency Medicine

## 2017-12-16 ENCOUNTER — Other Ambulatory Visit: Payer: Self-pay

## 2017-12-16 ENCOUNTER — Emergency Department: Payer: Medicaid Other

## 2017-12-16 DIAGNOSIS — M4050 Lordosis, unspecified, site unspecified: Secondary | ICD-10-CM | POA: Diagnosis not present

## 2017-12-16 DIAGNOSIS — Z79899 Other long term (current) drug therapy: Secondary | ICD-10-CM | POA: Diagnosis not present

## 2017-12-16 DIAGNOSIS — M25511 Pain in right shoulder: Secondary | ICD-10-CM | POA: Insufficient documentation

## 2017-12-16 DIAGNOSIS — F17290 Nicotine dependence, other tobacco product, uncomplicated: Secondary | ICD-10-CM | POA: Insufficient documentation

## 2017-12-16 MED ORDER — IBUPROFEN 600 MG PO TABS: 600.0000 mg | ORAL_TABLET | Freq: Three times a day (TID) | ORAL | 0 refills | Status: DC | PRN

## 2017-12-16 MED ORDER — HYDROMORPHONE HCL 1 MG/ML IJ SOLN
1.0000 mg | Freq: Once | INTRAMUSCULAR | Status: AC
  Administered 2017-12-16: 1 mg via INTRAMUSCULAR
  Filled 2017-12-16: qty 1

## 2017-12-16 MED ORDER — CYCLOBENZAPRINE HCL 10 MG PO TABS: 10.0000 mg | ORAL_TABLET | Freq: Three times a day (TID) | ORAL | 0 refills | Status: DC | PRN

## 2017-12-16 MED ORDER — TRAMADOL HCL 50 MG PO TABS: 50.0000 mg | ORAL_TABLET | Freq: Two times a day (BID) | ORAL | 0 refills | Status: DC | PRN

## 2017-12-16 MED ORDER — CYCLOBENZAPRINE HCL 10 MG PO TABS
10.0000 mg | ORAL_TABLET | Freq: Once | ORAL | Status: AC
  Administered 2017-12-16: 10 mg via ORAL
  Filled 2017-12-16: qty 1

## 2017-12-16 NOTE — ED Notes (Signed)
Pt verbally acknowledged she has a ride home.

## 2017-12-16 NOTE — Discharge Instructions (Signed)
Follow discharge care instruction take medication as directed. °

## 2017-12-16 NOTE — ED Provider Notes (Signed)
[facility] Emergency Department Provider Note   ____________________________________________   First MD Initiated Contact with Patient 12/16/17 1141     (approximate)  I have reviewed the triage vital signs and the nursing notes.   HISTORY  Chief Complaint Shoulder Pain    HPI Lauren Carrillo is a 27 y.o. female patient complain of right shoulder pain which appears to be radiating from her neck.  Patient states she awakened with  Complaint this morning..  Patient has been sleep on the floor at her parents house the past week.  No other provocative measures for complaint.  Patient state pain increases with abduction/ overhead reaching.  Patient denies numbness or tingling to the right upper extremity.  Patient rates the pain as a 10/10.  Patient described the pain as "shooting/sharp".  No palliative measures for complaint.   Past Medical History:  Diagnosis Date  . History of hiatal hernia     Patient Active Problem List   Diagnosis Date Noted  . Bradycardia 05/07/2017  . Suprvsn of high risk preg due to social problems, third tri 05/05/2017  . Abdominal pain in pregnancy 02/14/2017  . Labor and delivery indication for care or intervention 12/31/2016  . Acute bronchitis 01/24/2015  . Costochondritis 01/24/2015  . Smoker 01/24/2015    Past Surgical History:  Procedure Laterality Date  . NO PAST SURGERIES    . WISDOM TOOTH EXTRACTION      Prior to Admission medications   Medication Sig Start Date End Date Taking? Authorizing Provider  azithromycin (ZITHROMAX) 250 MG tablet Take 250 mg by mouth daily.    [provider]  benzonatate (TESSALON) 100 MG capsule Take 1 capsule (100 mg total) by mouth 3 (three) times daily as needed. Patient not taking: Reported on 12/31/2016 01/24/15   Saguier, Ramon Dredge, PA-C  cyclobenzaprine (FLEXERIL) 10 MG tablet Take 1 tablet (10 mg total) by mouth 3 (three) times daily as needed. 12/16/17   Joni Reining,  PA-C  fluticasone (FLONASE) 50 MCG/ACT nasal spray Place 2 sprays into both nostrils daily. Patient not taking: Reported on 12/31/2016 01/24/15   Saguier, Ramon Dredge, PA-C  ibuprofen (ADVIL,MOTRIN) 600 MG tablet Take 1 tablet (600 mg total) by mouth every 6 (six) hours. 05/07/17   Ward, Elenora Fender, MD  ibuprofen (ADVIL,MOTRIN) 600 MG tablet Take 1 tablet (600 mg total) by mouth every 8 (eight) hours as needed. 12/16/17   Joni Reining, PA-C  Prenatal Vit-Fe Fumarate-FA (MULTIVITAMIN-PRENATAL) 27-0.8 MG TABS tablet Take 1 tablet by mouth daily at 12 noon.    [provider]  traMADol (ULTRAM) 50 MG tablet Take 1 tablet (50 mg total) by mouth every 12 (twelve) hours as needed. 12/16/17   Joni Reining, PA-C    Allergies Patient has no known allergies.  Family History  Problem Relation Age of Onset  . Cancer Father        lung    Social History Social History   Tobacco Use  . Smoking status: Current Every Day Smoker    Packs/day: 1.00    Years: 4.00    Pack years: 4.00    Types: Cigars  . Smokeless tobacco: Never Used  Substance Use Topics  . Alcohol use: Yes    Comment: rare 1-2 times a year.  . Drug use: Yes    Frequency: 7.0 times per week    Types: Marijuana    Comment: every day    Review of Systems Constitutional: No fever/chills Eyes: No  visual changes. ENT: No sore throat. Cardiovascular: Denies chest pain. Respiratory: Denies shortness of breath. Gastrointestinal: No abdominal pain.  No nausea, no vomiting.  No diarrhea.  No constipation. Genitourinary: Negative for dysuria. Musculoskeletal: Neck and right shoulder pain. Skin: Negative for rash. Neurological: Negative for headaches, focal weakness or numbness.   ____________________________________________   PHYSICAL EXAM:  VITAL SIGNS: ED Triage Vitals  Enc Vitals Group     BP 12/16/17 1130 (!) 100/42     Pulse Rate 12/16/17 1130 71     Resp 12/16/17 1130 20     Temp 12/16/17 1130 98.2 F (36.8  C)     Temp Source 12/16/17 1130 Oral     SpO2 12/16/17 1130 99 %     Weight 12/16/17 1131 138 lb (62.6 kg)     Height 12/16/17 1131 5\' 1"  (1.549 m)     Head Circumference --      Peak Flow --      Pain Score 12/16/17 1131 10     Pain Loc --      Pain Edu? --      Excl. in GC? --     Constitutional: Alert and oriented.  Moderate distress.   Neck: No stridor.  Decreased range of motion left lateral and flexion movements.   Hematological/Lymphatic/Immunilogical: No cervical lymphadenopathy. Cardiovascular: Normal rate, regular rhythm. Grossly normal heart sounds.  Good peripheral circulation. Respiratory: Normal respiratory effort.  No retractions. Lungs CTAB. Musculoskeletal: No  Obvious cervical spinal deformity.  No obvious deformity to the upper extremity.  Patient has decreased range of motion abduction overhead reaching. Neurologic:  Normal speech and language. No gross focal neurologic deficits are appreciated. No gait instability. Skin:  Skin is warm, dry and intact. No rash noted. Psychiatric: Mood and affect are normal. Speech and behavior are normal.  ____________________________________________   LABS (all labs ordered are listed, but only abnormal results are displayed)  Labs Reviewed - No data to display ____________________________________________  EKG  EKG read by heart station ducted with no acute findings. ____________________________________________  RADIOLOGY  ED MD interpretation:    Official radiology report(s): Dg Cervical Spine Complete  Result Date: 12/16/2017 CLINICAL DATA:  27 year old female with sudden onset pain radiating from the neck to the right shoulder and down the arm. No known injury. EXAM: CERVICAL SPINE - COMPLETE 4+ VIEW COMPARISON:  Cervical radiographs 01/22/2012. FINDINGS: Chronic straightening of cervical lordosis. Normal prevertebral soft tissue contour. Cervicothoracic junction alignment is within normal limits. Preserved disc  spaces. Bilateral posterior element alignment is within normal limits. Normal AP alignment. Normal C1-C2 alignment and joint space. Negative odontoid. Negative visible upper chest. IMPRESSION: Stable radiographic appearance of the cervical spine since 2013, negative aside from chronic straightening of lordosis. Electronically Signed   By: Odessa Fleming M.D.   On: 12/16/2017 12:32    ____________________________________________   PROCEDURES  Procedure(s) performed: None  Procedures  Critical Care performed: No  ____________________________________________   INITIAL IMPRESSION / ASSESSMENT AND PLAN / ED COURSE  As part of my medical decision making, I reviewed the following data within the electronic MEDICAL RECORD NUMBER    Neck and right shoulder pain secondary to cervical lordosis.  Discussed x-ray findings with patient.  Patient given discharge care instruction advised take medication as directed.  Patient advised to follow-up with the [facility] if condition persists.      ____________________________________________   FINAL CLINICAL IMPRESSION(S) / ED DIAGNOSES  Final diagnoses:  Acute pain of right shoulder  Lordosis     ED Discharge Orders        Ordered    traMADol (ULTRAM) 50 MG tablet  Every 12 hours PRN     12/16/17 1240    cyclobenzaprine (FLEXERIL) 10 MG tablet  3 times daily PRN     12/16/17 1240    ibuprofen (ADVIL,MOTRIN) 600 MG tablet  Every 8 hours PRN     12/16/17 1240       Note:  This document was prepared using Dragon voice recognition software and may include unintentional dictation errors.    Joni ReiningSmith, Ronald K, PA-C 12/16/17 1243    Schaevitz, Myra Rudeavid Matthew, MD 12/16/17 [phone]

## 2017-12-16 NOTE — ED Notes (Signed)
First Nurse Note: Pt states that she is having pain in the right side of her neck and in her back. Pt is in NAD at this time.

## 2017-12-16 NOTE — ED Notes (Signed)
Pt states she picked up her youngest son and had to put him back down and then lay on the floor for relief. Pt states it hurts to move her right arm and goes up her neck. EKG performed in triage.

## 2017-12-16 NOTE — ED Triage Notes (Addendum)
Pt presents to ED with c/o R  shoulder pain with sudden onset this morning. Pt states pain radiates to her neck and down her arm. Pt states pain is worse with movement at this time. Pt denies any known injury to shoulder.

## 2019-02-11 ENCOUNTER — Other Ambulatory Visit: Payer: Self-pay

## 2019-02-11 ENCOUNTER — Emergency Department
Admission: EM | Admit: 2019-02-11 | Discharge: 2019-02-11 | Disposition: A | Discharge status: Home / Self Care | Payer: Medicaid Other | Attending: Emergency Medicine | Admitting: Emergency Medicine

## 2019-02-11 ENCOUNTER — Encounter: Payer: Self-pay | Admitting: Emergency Medicine

## 2019-02-11 DIAGNOSIS — R21 Rash and other nonspecific skin eruption: Secondary | ICD-10-CM | POA: Diagnosis present

## 2019-02-11 DIAGNOSIS — F1210 Cannabis abuse, uncomplicated: Secondary | ICD-10-CM | POA: Insufficient documentation

## 2019-02-11 DIAGNOSIS — Z79899 Other long term (current) drug therapy: Secondary | ICD-10-CM | POA: Insufficient documentation

## 2019-02-11 DIAGNOSIS — F17290 Nicotine dependence, other tobacco product, uncomplicated: Secondary | ICD-10-CM | POA: Diagnosis not present

## 2019-02-11 LAB — GROUP A STREP BY PCR: Group A Strep by PCR: NOT DETECTED

## 2019-02-11 MED ORDER — DEXAMETHASONE SODIUM PHOSPHATE 10 MG/ML IJ SOLN
10.0000 mg | Freq: Once | INTRAMUSCULAR | Status: AC
  Administered 2019-02-11: 10 mg via INTRAMUSCULAR
  Filled 2019-02-11: qty 1

## 2019-02-11 MED ORDER — PREDNISONE 10 MG (21) PO TBPK: ORAL_TABLET | ORAL | 0 refills | Status: AC

## 2019-02-11 MED ORDER — HYDROXYZINE HCL 25 MG PO TABS: 25.0000 mg | ORAL_TABLET | Freq: Three times a day (TID) | ORAL | 0 refills | Status: AC | PRN

## 2019-02-11 NOTE — Discharge Instructions (Addendum)
Take your medications as prescribed.  Return emergency department worsening.  Or you may see your regular doctor.

## 2019-02-11 NOTE — ED Provider Notes (Signed)
[facility] Emergency Department Provider Note  ____________________________________________   First MD Initiated Contact with Patient 02/11/19 [phone]     (approximate)  I have reviewed the triage vital signs and the nursing notes.   HISTORY  Chief Complaint Rash    HPI Lauren Carrillo is a 28 y.o. female presents emergency department complaining of itchy rash started on the left forearm yesterday and is now on the face, chest, upper body.  She took Benadryl last night which did not help with the itching but did not occur out.  She is not had any new exposures.  No recent travel.  No viral type symptoms.  She denies tick bite.  She states she has not been outside in the yard or in the woods.    Past Medical History:  Diagnosis Date  . History of hiatal hernia     Patient Active Problem List   Diagnosis Date Noted  . Bradycardia 05/07/2017  . Suprvsn of high risk preg due to social problems, third tri 05/05/2017  . Abdominal pain in pregnancy 02/14/2017  . Labor and delivery indication for care or intervention 12/31/2016  . Acute bronchitis 01/24/2015  . Costochondritis 01/24/2015  . Smoker 01/24/2015    Past Surgical History:  Procedure Laterality Date  . NO PAST SURGERIES    . WISDOM TOOTH EXTRACTION      Prior to Admission medications   Medication Sig Start Date End Date Taking? Authorizing Provider  hydrOXYzine (ATARAX/VISTARIL) 25 MG tablet Take 1 tablet (25 mg total) by mouth 3 (three) times daily as needed. 02/11/19   Fisher, Roselyn BeringSusan W, PA-C  predniSONE (STERAPRED UNI-PAK 21 TAB) 10 MG (21) TBPK tablet Take 6 pills on day one then decrease by 1 pill each day 02/11/19   Faythe GheeFisher, Susan W, PA-C  Prenatal Vit-Fe Fumarate-FA (MULTIVITAMIN-PRENATAL) 27-0.8 MG TABS tablet Take 1 tablet by mouth daily at 12 noon.    [provider]  fluticasone (FLONASE) 50 MCG/ACT nasal spray Place 2 sprays into both nostrils daily. Patient not taking:  Reported on 12/31/2016 01/24/15 02/11/19  Saguier, Ramon DredgeEdward, PA-C    Allergies Patient has no known allergies.  Family History  Problem Relation Age of Onset  . Cancer Father        lung    Social History Social History   Tobacco Use  . Smoking status: Current Every Day Smoker    Packs/day: 1.00    Years: 4.00    Pack years: 4.00    Types: Cigars  . Smokeless tobacco: Never Used  Substance Use Topics  . Alcohol use: Yes    Comment: rare 1-2 times a year.  . Drug use: Yes    Frequency: 7.0 times per week    Types: Marijuana    Comment: every day    Review of Systems  Constitutional: No fever/chills Eyes: No visual changes. ENT: No sore throat. Respiratory: Denies cough Genitourinary: Negative for dysuria. Musculoskeletal: Negative for back pain. Skin: Positive for rash.    ____________________________________________   PHYSICAL EXAM:  VITAL SIGNS: ED Triage Vitals  Enc Vitals Group     BP 02/11/19 0851 (!) 106/59     Pulse Rate 02/11/19 0851 90     Resp 02/11/19 0851 16     Temp 02/11/19 0851 98.3 F (36.8 C)     Temp Source 02/11/19 0851 Oral     SpO2 02/11/19 0851 99 %     Weight --      Height --  Head Circumference --      Peak Flow --      Pain Score 02/11/19 0847 0     Pain Loc --      Pain Edu? --      Excl. in [place]? --     Constitutional: Alert and oriented. Well appearing and in no acute distress. Eyes: Conjunctivae are normal.  Head: Atraumatic. Nose: No congestion/rhinnorhea. Mouth/Throat: Mucous membranes are moist.  Throat is mildly red Neck:  supple no lymphadenopathy noted Cardiovascular: Normal rate, regular rhythm. Heart sounds are normal Respiratory: Normal respiratory effort.  No retractions, lungs c t a  GU: deferred Musculoskeletal: FROM all extremities, warm and well perfused Neurologic:  Normal speech and language.  Skin:  Skin is warm, dry and intact.  Large red patch noted on the right side of the face with small  pinpoint type bumps noted on the face, neck chest back of the neck, upper back, and both forearms.  No blisters or drainage noted. Psychiatric: Mood and affect are normal. Speech and behavior are normal.  ____________________________________________   LABS (all labs ordered are listed, but only abnormal results are displayed)  Labs Reviewed  GROUP A STREP BY PCR   ____________________________________________   ____________________________________________  RADIOLOGY    ____________________________________________   PROCEDURES  Procedure(s) performed: Decadron 10 mg IM   Procedures    ____________________________________________   INITIAL IMPRESSION / ASSESSMENT AND PLAN / ED COURSE  Pertinent labs & imaging results that were available during my care of the patient were reviewed by me and considered in my medical decision making (see chart for details).   Patient is 28 year old female presents emergency department with complaints of a rash.  Physical exam as noted above.  Concerns of a strep-like rash so a strep test was ordered.  Patient was given an injection of Decadron 10 mg IM.  Strep test is negative.  Explained findings to the patient.  Due to her being 4 months postpartum unsure if this could be a hormonal type reaction.  However told her we would start her on a steroid pack along with Atarax for the itching.  If she is worsening she is to return emergency department or see her regular doctor.  She states she understands and will comply.  She was discharged in stable condition.   Lauren Carrillo was evaluated in Emergency Department on 02/11/2019 for the symptoms described in the history of present illness. She was evaluated in the context of the global COVID-19 pandemic, which necessitated consideration that the patient might be at risk for infection with the SARS-CoV-2 virus that causes COVID-19. Institutional protocols and algorithms that pertain to the  evaluation of patients at risk for COVID-19 are in a state of rapid change based on information released by regulatory bodies including the CDC and federal and state organizations. These policies and algorithms were followed during the patient's care in the ED.   As part of my medical decision making, I reviewed the following data within the [place] notes reviewed and incorporated, Labs reviewed strep test negative, Old chart reviewed, Notes from prior ED visits and [place] Controlled Substance Database  ____________________________________________   FINAL CLINICAL IMPRESSION(S) / ED DIAGNOSES  Final diagnoses:  Rash and nonspecific skin eruption      NEW MEDICATIONS STARTED DURING THIS VISIT:  New Prescriptions   HYDROXYZINE (ATARAX/VISTARIL) 25 MG TABLET    Take 1 tablet (25 mg total) by mouth 3 (three) times daily as needed.  PREDNISONE (STERAPRED UNI-PAK 21 TAB) 10 MG (21) TBPK TABLET    Take 6 pills on day one then decrease by 1 pill each day     Note:  This document was prepared using Dragon voice recognition software and may include unintentional dictation errors.    Faythe GheeFisher, Susan W, PA-C 02/11/19 1043    Phineas SemenGoodman, [place], MD 02/11/19 1059

## 2019-02-11 NOTE — ED Triage Notes (Signed)
Pt to ED via POV c/o "bumps all over". Pt states that the bumps itch but are not painful. Pt is in NAD.

## 2019-02-11 NOTE — ED Notes (Signed)
Pt c/o itchy rash that started on the left FA yesterday and is now all over, more concentrated on the face today. Pt denies having any known allergies. States she did take benadryl last night.

## 2019-02-11 NOTE — ED Notes (Signed)
No changes with the rash

## 2019-03-26 ENCOUNTER — Other Ambulatory Visit: Payer: Self-pay

## 2019-03-26 ENCOUNTER — Emergency Department
Admission: EM | Admit: 2019-03-26 | Discharge: 2019-03-27 | Disposition: A | Discharge status: Home / Self Care | Payer: Medicaid Other | Attending: Emergency Medicine | Admitting: Emergency Medicine

## 2019-03-26 DIAGNOSIS — N3001 Acute cystitis with hematuria: Secondary | ICD-10-CM | POA: Insufficient documentation

## 2019-03-26 DIAGNOSIS — F17210 Nicotine dependence, cigarettes, uncomplicated: Secondary | ICD-10-CM | POA: Insufficient documentation

## 2019-03-26 DIAGNOSIS — F1210 Cannabis abuse, uncomplicated: Secondary | ICD-10-CM | POA: Insufficient documentation

## 2019-03-26 DIAGNOSIS — N83202 Unspecified ovarian cyst, left side: Secondary | ICD-10-CM | POA: Diagnosis not present

## 2019-03-26 DIAGNOSIS — R300 Dysuria: Secondary | ICD-10-CM | POA: Diagnosis present

## 2019-03-26 DIAGNOSIS — R1032 Left lower quadrant pain: Secondary | ICD-10-CM

## 2019-03-26 LAB — URINALYSIS, COMPLETE (UACMP) WITH MICROSCOPIC
Bacteria, UA: NONE SEEN
Bilirubin Urine: NEGATIVE
Glucose, UA: NEGATIVE mg/dL
Ketones, ur: NEGATIVE mg/dL
Nitrite: NEGATIVE
Protein, ur: 30 mg/dL — AB
Specific Gravity, Urine: 1.013 (ref 1.005–1.030)
WBC, UA: >50 WBC/hpf — ABNORMAL HIGH (ref 0–5)
pH: 6 (ref 5.0–8.0)

## 2019-03-26 LAB — CBC
HCT: 42.1 % (ref 36.0–46.0)
Hemoglobin: 13.6 g/dL (ref 12.0–15.0)
MCH: 26.8 pg (ref 26.0–34.0)
MCHC: 32.3 g/dL (ref 30.0–36.0)
MCV: 82.9 fL (ref 80.0–100.0)
Platelets: 344 10*3/uL (ref 150–400)
RBC: 5.08 MIL/uL (ref 3.87–5.11)
RDW: 16.2 % — ABNORMAL HIGH (ref 11.5–15.5)
WBC: 19.9 10*3/uL — ABNORMAL HIGH (ref 4.0–10.5)
nRBC: 0 % (ref 0.0–0.2)

## 2019-03-26 LAB — COMPREHENSIVE METABOLIC PANEL
ALT: 7 U/L (ref 0–44)
AST: 13 U/L — ABNORMAL LOW (ref 15–41)
Albumin: 4.1 g/dL (ref 3.5–5.0)
Alkaline Phosphatase: 71 U/L (ref 38–126)
Anion gap: 10 (ref 5–15)
BUN: 11 mg/dL (ref 6–20)
CO2: 24 mmol/L (ref 22–32)
Calcium: 9.1 mg/dL (ref 8.9–10.3)
Chloride: 105 mmol/L (ref 98–111)
Creatinine, Ser: 0.7 mg/dL (ref 0.44–1.00)
GFR calc Af Amer: >60 mL/min (ref >60)
GFR calc non Af Amer: >60 mL/min (ref >60)
Glucose, Bld: 100 mg/dL — ABNORMAL HIGH (ref 70–99)
Potassium: 3.9 mmol/L (ref 3.5–5.1)
Sodium: 139 mmol/L (ref 135–145)
Total Bilirubin: 0.4 mg/dL (ref 0.3–1.2)
Total Protein: 7.4 g/dL (ref 6.5–8.1)

## 2019-03-26 LAB — POCT PREGNANCY, URINE: Preg Test, Ur: NEGATIVE

## 2019-03-26 NOTE — ED Notes (Signed)
Pt has left lower abd pain.  Sx began yesterday.  Pt reports dysuria.  No back pain.  No n/v/d.  Pt denies vag bleeding  Pt alert  Speech clear

## 2019-03-26 NOTE — ED Triage Notes (Signed)
Pt in with co painful urination, urgency and left lower abd pain. Denies any abnormal discharge, no fever.

## 2019-03-27 ENCOUNTER — Emergency Department: Payer: Medicaid Other

## 2019-03-27 LAB — HCG, QUANTITATIVE, PREGNANCY: hCG, Beta Chain, Quant, S: <1 m[IU]/mL (ref <5)

## 2019-03-27 LAB — WET PREP, GENITAL
Sperm: NONE SEEN
Trich, Wet Prep: NONE SEEN
Yeast Wet Prep HPF POC: NONE SEEN

## 2019-03-27 MED ORDER — KETOROLAC TROMETHAMINE 30 MG/ML IJ SOLN
15.0000 mg | Freq: Once | INTRAMUSCULAR | Status: AC
  Administered 2019-03-27: 15 mg via INTRAVENOUS
  Filled 2019-03-27: qty 1

## 2019-03-27 MED ORDER — SODIUM CHLORIDE 0.9 % IV SOLN
1.0000 g | Freq: Once | INTRAVENOUS | Status: AC
  Administered 2019-03-27: 02:00:00 1 g via INTRAVENOUS
  Filled 2019-03-27: qty 10

## 2019-03-27 MED ORDER — CEPHALEXIN 500 MG PO CAPS: 500.0000 mg | ORAL_CAPSULE | Freq: Three times a day (TID) | ORAL | 0 refills | Status: AC

## 2019-03-27 NOTE — ED Provider Notes (Signed)
[facility] Emergency Department Provider Note  ____________________________________________  Time seen: Approximately 2:17 AM  I have reviewed the triage vital signs and the nursing notes.   HISTORY  Chief Complaint Dysuria and Abdominal Pain   HPI Lauren Carrillo is a 28 y.o. female presents for evaluation of left lower quadrant abdominal pain.   Patient reports that her symptoms started yesterday.  She has had several days of dysuria.  Denies vaginal discharge.  She is sexually active.  Denies any prior history of STD.  The pain is constant, throbbing, located in the left lower quadrant, radiating to her back.  No nausea, no vomiting, no diarrhea, no constipation.  Patient took laxatives this morning thinking it could be constipation.  She had a bowel movement with no significant relief of her pain.  No prior abdominal surgeries.  Past Medical History:  Diagnosis Date   History of hiatal hernia     Patient Active Problem List   Diagnosis Date Noted   Bradycardia 05/07/2017   Suprvsn of high risk preg due to social problems, third tri 05/05/2017   Abdominal pain in pregnancy 02/14/2017   Labor and delivery indication for care or intervention 12/31/2016   Acute bronchitis 01/24/2015   Costochondritis 01/24/2015   Smoker 01/24/2015    Past Surgical History:  Procedure Laterality Date   NO PAST SURGERIES     WISDOM TOOTH EXTRACTION      Prior to Admission medications   Medication Sig Start Date End Date Taking? Authorizing Provider  cephALEXin (KEFLEX) 500 MG capsule Take 1 capsule (500 mg total) by mouth 3 (three) times daily for 7 days. 03/27/19 04/03/19  Nita SickleVeronese, [place], MD  hydrOXYzine (ATARAX/VISTARIL) 25 MG tablet Take 1 tablet (25 mg total) by mouth 3 (three) times daily as needed. 02/11/19   Fisher, Roselyn BeringSusan W, PA-C  predniSONE (STERAPRED UNI-PAK 21 TAB) 10 MG (21) TBPK tablet Take 6 pills on day one then decrease by 1 pill each  day 02/11/19   Faythe GheeFisher, Susan W, PA-C  Prenatal Vit-Fe Fumarate-FA (MULTIVITAMIN-PRENATAL) 27-0.8 MG TABS tablet Take 1 tablet by mouth daily at 12 noon.    [provider]  fluticasone (FLONASE) 50 MCG/ACT nasal spray Place 2 sprays into both nostrils daily. Patient not taking: Reported on 12/31/2016 01/24/15 02/11/19  Saguier, Ramon DredgeEdward, PA-C    Allergies Patient has no known allergies.  Family History  Problem Relation Age of Onset   Cancer Father        lung    Social History Social History   Tobacco Use   Smoking status: Current Every Day Smoker    Packs/day: 1.00    Years: 4.00    Pack years: 4.00    Types: Cigars   Smokeless tobacco: Never Used  Substance Use Topics   Alcohol use: Yes    Comment: rare 1-2 times a year.   Drug use: Yes    Frequency: 7.0 times per week    Types: Marijuana    Comment: every day    Review of Systems  Constitutional: Negative for fever. Eyes: Negative for visual changes. ENT: Negative for sore throat. Neck: No neck pain  Cardiovascular: Negative for chest pain. Respiratory: Negative for shortness of breath. Gastrointestinal: + LLQ abdominal pain. No vomiting or diarrhea. Genitourinary: + dysuria. Musculoskeletal: Negative for back pain. Skin: Negative for rash. Neurological: Negative for headaches, weakness or numbness. Psych: No SI or HI  ____________________________________________   PHYSICAL EXAM:  VITAL SIGNS: ED Triage Vitals [03/26/19  2319]  Enc Vitals Group     BP (!) 111/41     Pulse Rate 82     Resp 20     Temp 98.1 F (36.7 C)     Temp Source Oral     SpO2 97 %     Weight 130 lb (59 kg)     Height 5\' 1"  (1.549 m)     Head Circumference      Peak Flow      Pain Score 10     Pain Loc      Pain Edu?      Excl. in [place]?     Constitutional: Alert and oriented. Well appearing and in no apparent distress. HEENT:      Head: Normocephalic and atraumatic.         Eyes: Conjunctivae are normal. Sclera is  non-icteric.       Mouth/Throat: Mucous membranes are moist.       Neck: Supple with no signs of meningismus. Cardiovascular: Regular rate and rhythm. No murmurs, gallops, or rubs. 2+ symmetrical distal pulses are present in all extremities. No JVD. Respiratory: Normal respiratory effort. Lungs are clear to auscultation bilaterally. No wheezes, crackles, or rhonchi.  Gastrointestinal: Soft, tender to palpation over the LLQ, and non distended with positive bowel sounds. No rebound or guarding. Genitourinary: No CVA tenderness. Pelvic exam: Normal external genitalia, no rashes or lesions. Normal cervical mucus. Os closed. No cervical motion tenderness.  No uterine or adnexal tenderness.  IUD string in place Musculoskeletal: Nontender with normal range of motion in all extremities. No edema, cyanosis, or erythema of extremities. Neurologic: Normal speech and language. Face is symmetric. Moving all extremities. No gross focal neurologic deficits are appreciated. Skin: Skin is warm, dry and intact. No rash noted. Psychiatric: Mood and affect are normal. Speech and behavior are normal.  ____________________________________________   LABS (all labs ordered are listed, but only abnormal results are displayed)  Labs Reviewed  WET PREP, GENITAL - Abnormal; Notable for the following components:      Result Value   Clue Cells Wet Prep HPF POC PRESENT (*)    WBC, Wet Prep HPF POC MODERATE (*)    All other components within normal limits  CBC - Abnormal; Notable for the following components:   WBC 19.9 (*)    RDW 16.2 (*)    All other components within normal limits  COMPREHENSIVE METABOLIC PANEL - Abnormal; Notable for the following components:   Glucose, Bld 100 (*)    AST 13 (*)    All other components within normal limits  URINALYSIS, COMPLETE (UACMP) WITH MICROSCOPIC - Abnormal; Notable for the following components:   Color, Urine YELLOW (*)    APPearance CLOUDY (*)    Hgb urine dipstick  SMALL (*)    Protein, ur 30 (*)    Leukocytes,Ua LARGE (*)    WBC, UA >50 (*)    Non Squamous Epithelial PRESENT (*)    All other components within normal limits  URINE CULTURE  GC/CHLAMYDIA PROBE AMP  HCG, QUANTITATIVE, PREGNANCY  POC URINE PREG, ED  POCT PREGNANCY, URINE   ____________________________________________  EKG  none  ____________________________________________  RADIOLOGY  I have personally reviewed the images performed during this visit and I agree with the Radiologist's read.   Interpretation by Radiologist:  US Pelvic Complete W Transvaginal And Torsion R/o  Result Date: 03/27/2019 CLINICAL DATA:  28 year old female with flank pain and painful urination. IUD in place. Unrevealing noncontrast CT  Abdomen and Pelvis earlier today aside from asymmetry of the left ovary thought to reflect cyst. EXAM: TRANSABDOMINAL AND TRANSVAGINAL ULTRASOUND OF PELVIS DOPPLER ULTRASOUND OF OVARIES TECHNIQUE: Both transabdominal and transvaginal ultrasound examinations of the pelvis were performed. Transabdominal technique was performed for global imaging of the pelvis including uterus, ovaries, adnexal regions, and pelvic cul-de-sac. It was necessary to proceed with endovaginal exam following the transabdominal exam to visualize the ovaries. Color and duplex Doppler ultrasound was utilized to evaluate blood flow to the ovaries. COMPARISON:  Noncontrast CT Abdomen and Pelvis earlier today. FINDINGS: Uterus Measurements: 7.8 x 3.8 x 4.9 centimeters = volume: 76 mL. No fibroids or other mass visualized. Endometrium Thickness: 2-3 millimeters.  Satisfactory IUD position (image 64). Right ovary Measurements: 2.5 x 1.5 x 1.6 centimeters = volume: 3 mL. Several small follicles. Normal appearance/no adnexal mass. Left ovary Measurements: 4.4 x 3.7 x 4.6 centimeters = volume: 40 mL. Simple appearing cyst measuring up to 4.2 centimeters (images 51, 117 and 119), with artifactual internal Doppler signal  occasionally suspected. Pulsed Doppler evaluation of both ovaries demonstrates normal low-resistance arterial and venous waveforms. Other findings No pelvic free fluid. IMPRESSION: 1. Negative for ovarian torsion. 2. Left ovarian simple cyst measuring up to 4.2 cm does not meet size criteria for follow-up in premenopausal patients. This follows consensus guidelines: Simple Adnexal Cysts: SRU Consensus Conference Update on Follow-up and Reporting. Radiology 2019; [id number]. 3. Normal right ovary.  Satisfactory IUD position. Electronically Signed   By: Odessa FlemingH  Hall M.D.   On: 03/27/2019 02:10     ____________________________________________   PROCEDURES  Procedure(s) performed: None Procedures Critical Care performed:  None ____________________________________________   INITIAL IMPRESSION / ASSESSMENT AND PLAN / ED COURSE   28 y.o. female presents for evaluation of left lower quadrant abdominal pain and dysuria.  Normal vital signs, well-appearing otherwise, abdomen is soft with left lower quadrant tenderness with no rebound or guarding.  Pelvic exam with no significant findings.  Differential diagnosis including ovarian pathology versus ectopic pregnancy versus pregnancy versus UTI versus pyelonephritis versus kidney stone versus diverticulitis versus STD versus PID versus tubo-ovarian abscess.  Patient with elevated white count of 19 but it seems patient always has an elevated white count per review of epic.  GC and Chlamydia are pending.  Wet prep positive for clue cells.  CT abdomen pelvis showing a left ovarian cyst but no other acute findings.  Patient was sent for a transvaginal ultrasound with showed a simple ovarian cyst with no evidence of torsion, no tubo-ovarian abscess.  Pregnancy test negative.  Pelvic exam with no CMT.  Urinalysis showing blood with greater than 50 WBCs, large leuks but no bacteria seen.  Urine culture sent.  Patient received a dose of Rocephin.  She received a dose of  Toradol for resolution of her pain.  Will discharge home on Keflex for UTI.  Discussed my standard return precautions and follow-up with PCP.  We will follow-up with patient once results of GC and Chlamydia are back.      As part of my medical decision making, I reviewed the following data within the electronic MEDICAL RECORD NUMBER Nursing notes reviewed and incorporated, Labs reviewed , Old chart reviewed, Radiograph reviewed , Notes from prior ED visits and [place] Controlled Substance Database   Patient was evaluated in Emergency Department today for the symptoms described in the history of present illness. Patient was evaluated in the context of the global COVID-19 pandemic, which necessitated consideration that the patient might be  at risk for infection with the SARS-CoV-2 virus that causes COVID-19. Institutional protocols and algorithms that pertain to the evaluation of patients at risk for COVID-19 are in a state of rapid change based on information released by regulatory bodies including the CDC and federal and state organizations. These policies and algorithms were followed during the patient's care in the ED.   ____________________________________________   FINAL CLINICAL IMPRESSION(S) / ED DIAGNOSES   Final diagnoses:  LLQ abdominal pain  Cyst of left ovary  Acute cystitis with hematuria      NEW MEDICATIONS STARTED DURING THIS VISIT:  ED Discharge Orders         Ordered    cephALEXin (KEFLEX) 500 MG capsule  3 times daily     03/27/19 0222           Note:  This document was prepared using Dragon voice recognition software and may include unintentional dictation errors.    Alfred Levins, [place], MD 03/28/19 [phone]

## 2019-03-29 LAB — URINE CULTURE: Culture: >=100000 — AB

## 2019-08-29 LAB — GC/CHLAMYDIA PROBE AMP
Chlamydia trachomatis, NAA: NEGATIVE
Neisseria Gonorrhoeae by PCR: NEGATIVE

## 2019-12-09 ENCOUNTER — Emergency Department (HOSPITAL_COMMUNITY)
Admission: EM | Admit: 2019-12-09 | Discharge: 2019-12-10 | Disposition: A | Discharge status: Home / Self Care | Payer: Medicaid Other | Attending: Emergency Medicine | Admitting: Emergency Medicine

## 2019-12-09 ENCOUNTER — Other Ambulatory Visit: Payer: Self-pay

## 2019-12-09 ENCOUNTER — Encounter (HOSPITAL_COMMUNITY): Payer: Self-pay | Admitting: *Deleted

## 2019-12-09 DIAGNOSIS — K047 Periapical abscess without sinus: Secondary | ICD-10-CM | POA: Diagnosis not present

## 2019-12-09 DIAGNOSIS — F17210 Nicotine dependence, cigarettes, uncomplicated: Secondary | ICD-10-CM | POA: Diagnosis not present

## 2019-12-09 DIAGNOSIS — K0889 Other specified disorders of teeth and supporting structures: Secondary | ICD-10-CM | POA: Diagnosis present

## 2019-12-09 NOTE — ED Triage Notes (Signed)
Pt c/o pain to right upper tooth area that started a few days ago,

## 2019-12-10 MED ORDER — AMOXICILLIN 500 MG PO CAPS: 500.0000 mg | ORAL_CAPSULE | Freq: Three times a day (TID) | ORAL | 0 refills | Status: AC

## 2019-12-10 MED ORDER — AMOXICILLIN 250 MG PO CAPS
500.0000 mg | ORAL_CAPSULE | Freq: Once | ORAL | Status: AC
  Administered 2019-12-10: 500 mg via ORAL
  Filled 2019-12-10: qty 2

## 2019-12-10 MED ORDER — TRAMADOL HCL 50 MG PO TABS: 50.0000 mg | ORAL_TABLET | Freq: Four times a day (QID) | ORAL | 0 refills | Status: AC | PRN

## 2019-12-10 MED ORDER — TRAMADOL HCL 50 MG PO TABS
50.0000 mg | ORAL_TABLET | Freq: Once | ORAL | Status: AC
  Administered 2019-12-10: 50 mg via ORAL
  Filled 2019-12-10: qty 1

## 2019-12-10 NOTE — Discharge Instructions (Signed)
Complete your entire course of antibiotics as prescribed.  You  may use the tramadol for pain relief but do not drive within 4 hours of taking as this will make you drowsy.  Avoid applying heat or ice to this area which can worsen your symptoms.  You may use warm salt water swish and spit treatment or half peroxide and water swish and spit after meals to keep this area clean as discussed.  See the dental referral list to obtain local dental care.

## 2019-12-10 NOTE — ED Provider Notes (Signed)
[facility] EMERGENCY DEPARTMENT Provider Note   CSN: [id number] Arrival date & time: 12/09/19  2326     History Chief Complaint  Patient presents with  . Dental Pain    Lauren Carrillo is a 29 y.o. female presenting for evaluation of dental pain.  She has a chronically fractured right upper third molar tooth which has developed aching pain that shoots up into her right maxillary region, worsening over the past several days.  She denies fevers or chills and has had no gingival swelling.  She does endorse hot and cold sensitivity at this region.  She has had difficulty eating in drinking secondary to the symptom.  She has taken multiple doses of ibuprofen which has not improved her symptoms.  She has a Education officer, community in [place], but recently moved here and would like a new dentist in this area.  The history is provided by the patient.       Past Medical History:  Diagnosis Date  . History of hiatal hernia     Patient Active Problem List   Diagnosis Date Noted  . Bradycardia 05/07/2017  . Suprvsn of high risk preg due to social problems, third tri 05/05/2017  . Abdominal pain in pregnancy 02/14/2017  . Labor and delivery indication for care or intervention 12/31/2016  . Acute bronchitis 01/24/2015  . Costochondritis 01/24/2015  . Smoker 01/24/2015    Past Surgical History:  Procedure Laterality Date  . NO PAST SURGERIES    . WISDOM TOOTH EXTRACTION       OB History    Gravida  2   Para  2   Term  2   Preterm      AB      Living  2     SAB      TAB      Ectopic      Multiple  0   Live Births  2           Family History  Problem Relation Age of Onset  . Cancer Father        lung    Social History   Tobacco Use  . Smoking status: Current Every Day Smoker    Packs/day: 1.00    Years: 4.00    Pack years: 4.00    Types: Cigars  . Smokeless tobacco: Never Used  Substance Use Topics  . Alcohol use: Yes    Comment: rare 1-2 times a year.   . Drug use: Yes    Frequency: 7.0 times per week    Types: Marijuana    Comment: every day    Home Medications Prior to Admission medications   Medication Sig Start Date End Date Taking? Authorizing Provider  amoxicillin (AMOXIL) 500 MG capsule Take 1 capsule (500 mg total) by mouth 3 (three) times daily for 10 days. 12/10/19 12/20/19  Burgess Amor, PA-C  hydrOXYzine (ATARAX/VISTARIL) 25 MG tablet Take 1 tablet (25 mg total) by mouth 3 (three) times daily as needed. 02/11/19   Fisher, Roselyn Bering, PA-C  predniSONE (STERAPRED UNI-PAK 21 TAB) 10 MG (21) TBPK tablet Take 6 pills on day one then decrease by 1 pill each day 02/11/19   Faythe Ghee, PA-C  Prenatal Vit-Fe Fumarate-FA (MULTIVITAMIN-PRENATAL) 27-0.8 MG TABS tablet Take 1 tablet by mouth daily at 12 noon.    [provider]  traMADol (ULTRAM) 50 MG tablet Take 1 tablet (50 mg total) by mouth every 6 (six) hours as needed. 12/10/19   Izzabell Klasen,  Almyra Free, PA-C  fluticasone (FLONASE) 50 MCG/ACT nasal spray Place 2 sprays into both nostrils daily. Patient not taking: Reported on 12/31/2016 01/24/15 02/11/19  Saguier, Percell Miller, PA-C    Allergies    Patient has no known allergies.  Review of Systems   Review of Systems  Constitutional: Negative for fever.  HENT: Positive for dental problem. Negative for ear discharge, ear pain, facial swelling, sore throat, trouble swallowing and voice change.   Respiratory: Negative for shortness of breath.   Musculoskeletal: Negative for neck pain and neck stiffness.  All other systems reviewed and are negative.   Physical Exam Updated Vital Signs BP (!) 112/59   Pulse 70   Temp (!) 97.3 F (36.3 C) (Oral)   Resp 16   Ht 5\' 1"  (1.549 m)   Wt 67.2 kg   LMP 11/12/2019   SpO2 100%   BMI 28.00 kg/m   Physical Exam Constitutional:      General: She is not in acute distress.    Appearance: She is well-developed.  HENT:     Head: Normocephalic and atraumatic.     Jaw: No trismus.     Right Ear:  Tympanic membrane and external ear normal.     Left Ear: Tympanic membrane and external ear normal.     Mouth/Throat:     Mouth: No oral lesions.     Dentition: Abnormal dentition. Dental tenderness present.      Comments: No trismus.  Sublingual space is soft. Eyes:     Conjunctiva/sclera: Conjunctivae normal.  Cardiovascular:     Rate and Rhythm: Normal rate.     Heart sounds: Normal heart sounds.  Pulmonary:     Effort: Pulmonary effort is normal.  Musculoskeletal:        General: Normal range of motion.     Cervical back: Normal range of motion and neck supple.  Lymphadenopathy:     Cervical: No cervical adenopathy.  Skin:    General: Skin is warm and dry.     Findings: No erythema.  Neurological:     Mental Status: She is alert and oriented to person, place, and time.     ED Results / Procedures / Treatments   Labs (all labs ordered are listed, but only abnormal results are displayed) Labs Reviewed - No data to display  EKG None  Radiology No results found.  Procedures Procedures (including critical care time)  Medications Ordered in ED Medications  amoxicillin (AMOXIL) capsule 500 mg (500 mg Oral Given 12/10/19 0059)  traMADol (ULTRAM) tablet 50 mg (50 mg Oral Given 12/10/19 0059)    ED Course  I have reviewed the triage vital signs and the nursing notes.  Pertinent labs & imaging results that were available during my care of the patient were reviewed by me and considered in my medical decision making (see chart for details).    MDM Rules/Calculators/A&P                      Patient with probable early apical abscess in her right upper third molar tooth.  She was started on amoxicillin.  She was given a 2-day course of tramadol after which she can switch back to her ibuprofen.  She was also given a local dentist list for obtaining a new primary dentist in this area. Final Clinical Impression(s) / ED Diagnoses Final diagnoses:  Dental infection    Rx /  DC Orders ED Discharge Orders  Ordered    amoxicillin (AMOXIL) 500 MG capsule  3 times daily     12/10/19 0046    traMADol (ULTRAM) 50 MG tablet  Every 6 hours PRN     12/10/19 0046           Burgess Amor, PA-C 12/10/19 0117    Devoria Albe, MD 12/10/19 [phone]

## 2020-04-14 IMAGING — US US PELVIS COMPLETE TRANSABD/TRANSVAG W DUPLEX
1 series · 13 of 25 positions shown · non-contrast
Comparison: Noncontrast CT Abdomen and Pelvis earlier today.

CLINICAL DATA: 28-year-old female with flank pain and painful
urination. IUD in place.

Unrevealing noncontrast CT Abdomen and Pelvis earlier today aside
from asymmetry of the left ovary thought to reflect cyst.
EXAM:
TRANSABDOMINAL AND TRANSVAGINAL ULTRASOUND OF PELVIS
DOPPLER ULTRASOUND OF OVARIES
TECHNIQUE: Both transabdominal and transvaginal ultrasound examinations of the
pelvis were performed. Transabdominal technique was performed for
global imaging of the pelvis including uterus, ovaries, adnexal
regions, and pelvic cul-de-sac.
It was necessary to proceed with endovaginal exam following the
transabdominal exam to visualize the ovaries. Color and duplex
Doppler ultrasound was utilized to evaluate blood flow to the
ovaries.

[Series 1: us pelvis complete transabd/transvag w duplex · 13 of 134 slices shown]
[im 1/134]
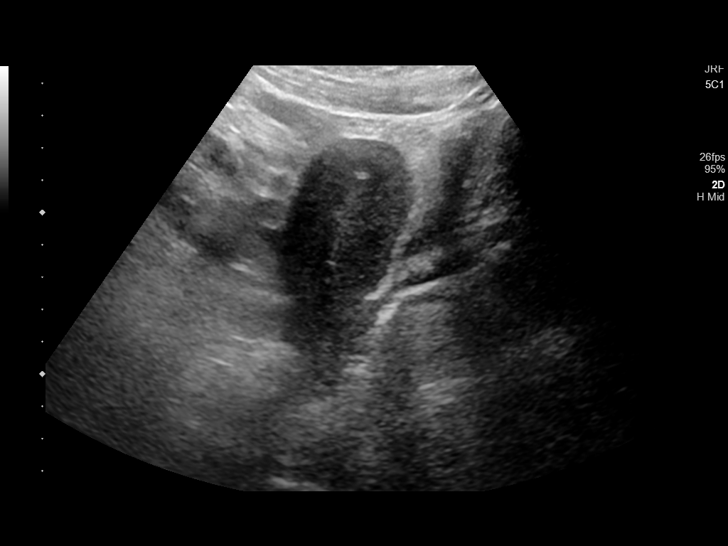
[im 12/134]
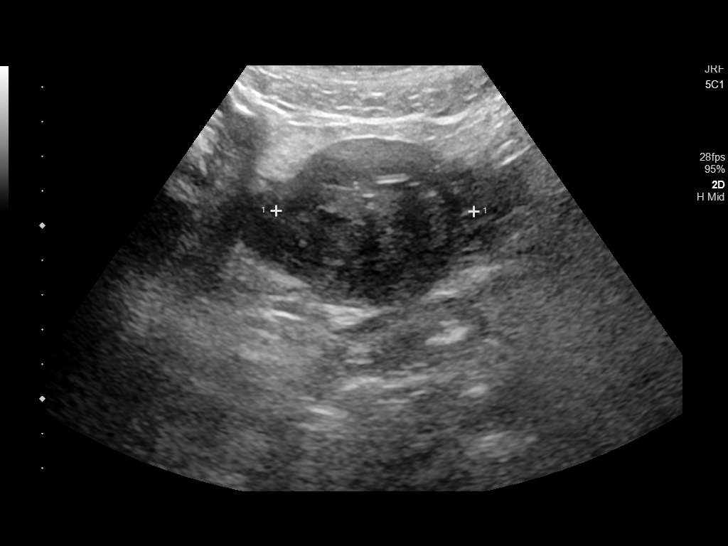
[im 23/134]
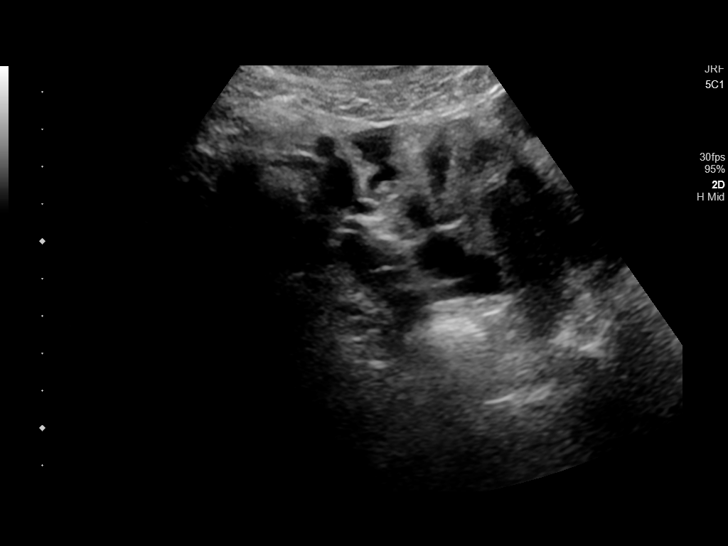
[im 34/134]
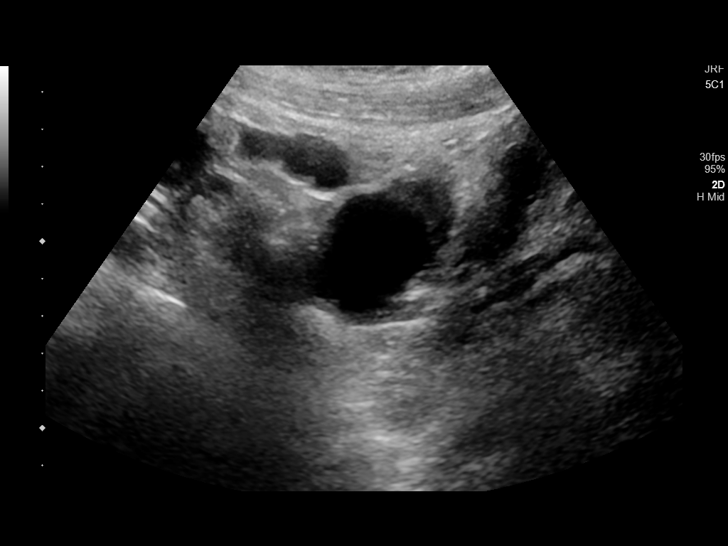
[im 45/134]
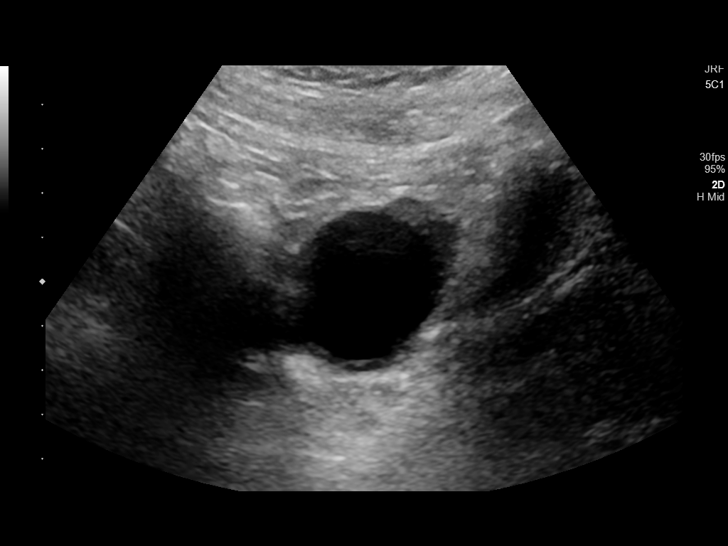
[im 56/134]
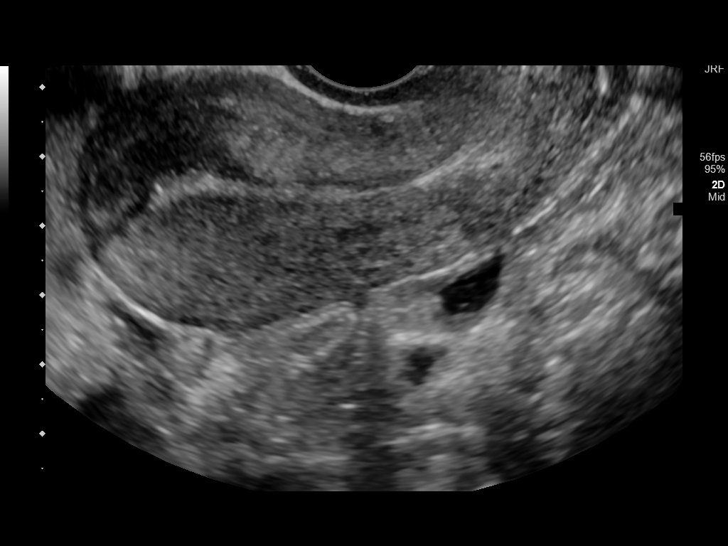
[im 67/134]
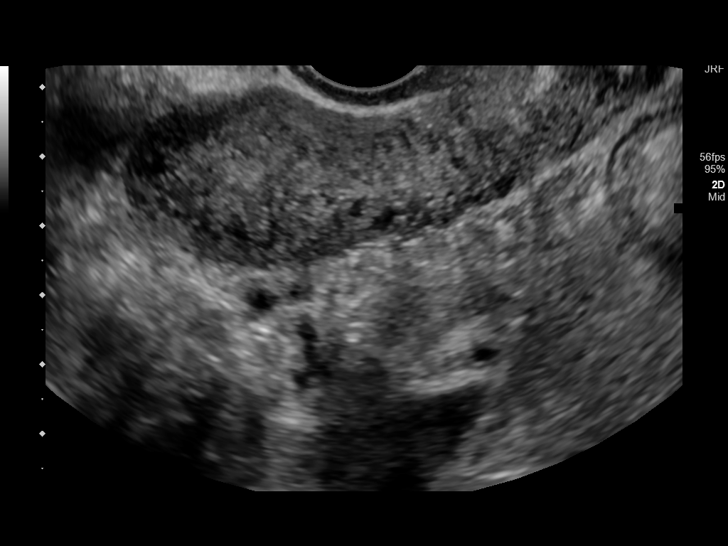
[im 78/134]
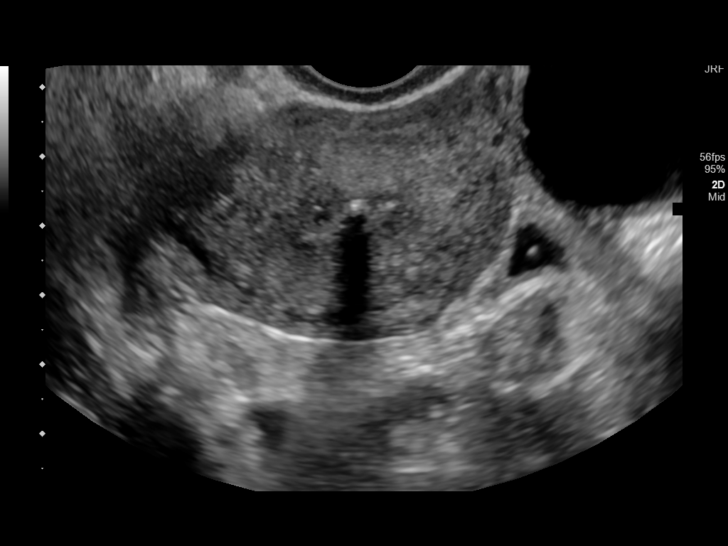
[im 89/134]
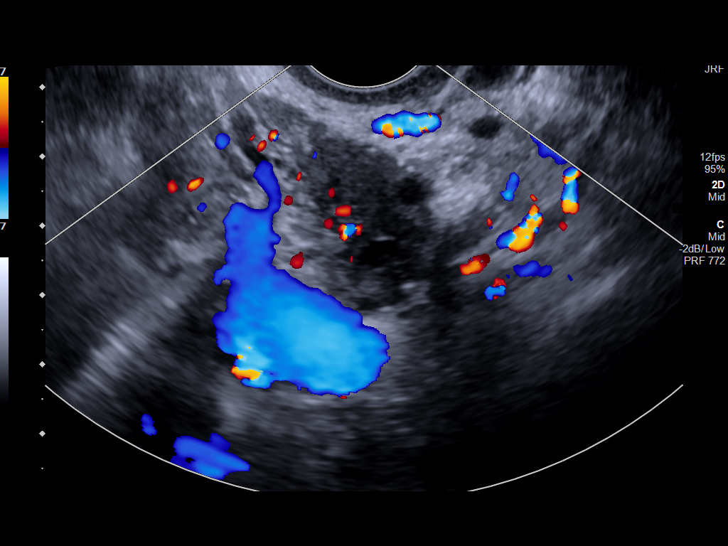
[im 100/134]
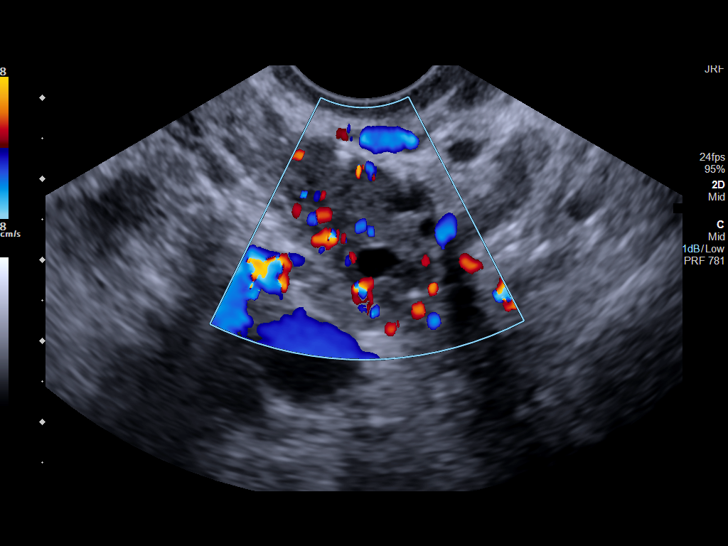
[im 111/134]
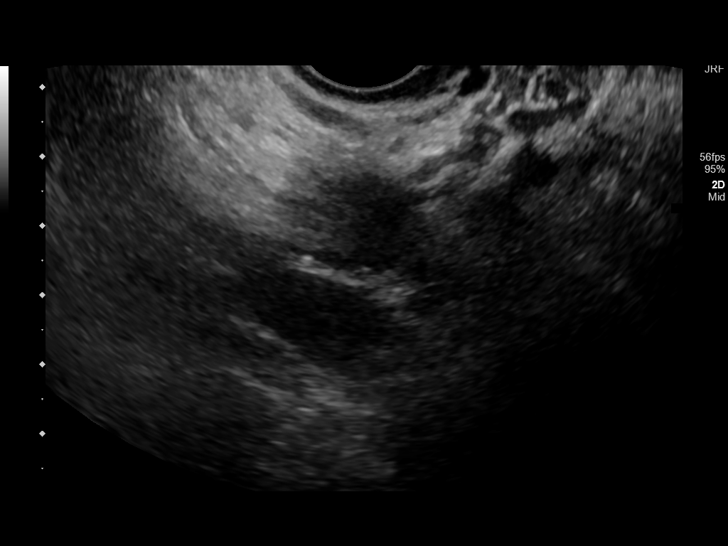
[im 122/134]
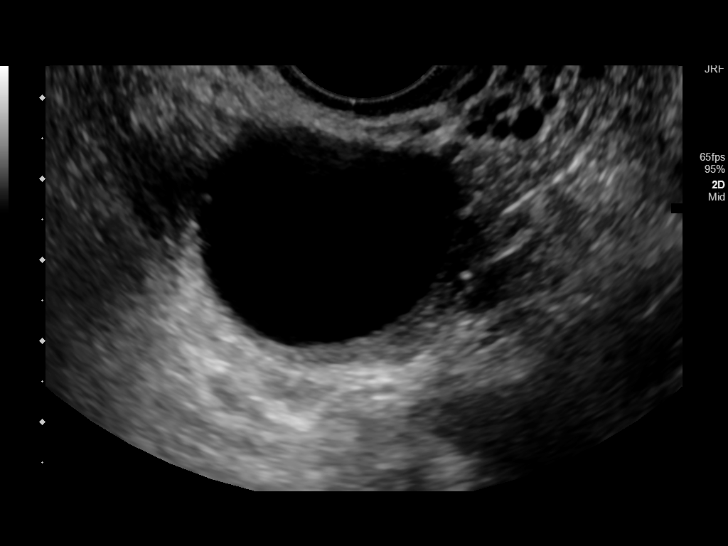
[im 134/134]
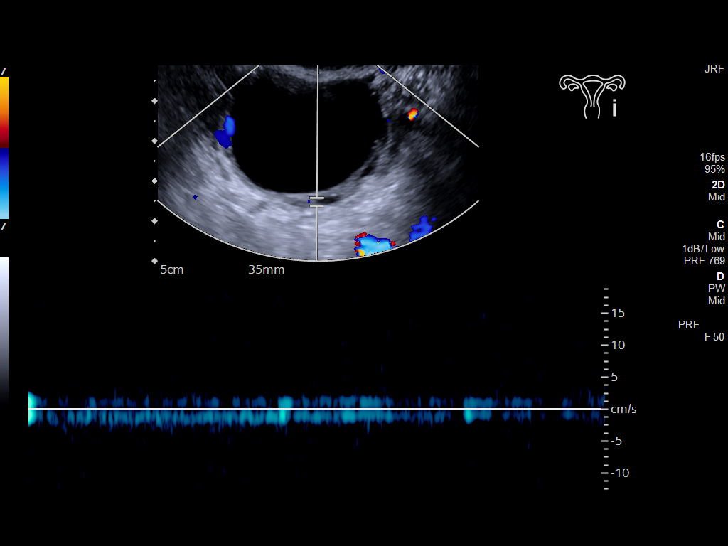

[13 of 25 positions shown; findings below may reference images not displayed]

FINDINGS: Uterus

Measurements: 7.8 x 3.8 x 4.9 centimeters = volume: 76 mL. No
fibroids or other mass visualized.

Endometrium

Thickness: 2-3 millimeters.  Satisfactory IUD position (image 64).

Right ovary

Measurements: 2.5 x 1.5 x 1.6 centimeters = volume: 3 mL. Several
small follicles. Normal appearance/no adnexal mass.

Left ovary

Measurements: 4.4 x 3.7 x 4.6 centimeters = volume: 40 mL. Simple
appearing cyst measuring up to 4.2 centimeters (images 51, 117 and
119), with artifactual internal Doppler signal occasionally
suspected.

Pulsed Doppler evaluation of both ovaries demonstrates normal
low-resistance arterial and venous waveforms.

Other findings

No pelvic free fluid.
IMPRESSION: 1. Negative for ovarian torsion.
2. Left ovarian simple cyst measuring up to 4.2 cm does not meet
size criteria for follow-up in premenopausal patients. This follows
consensus guidelines: Simple Adnexal Cysts: SRU Consensus Conference
Update on Follow-up and Reporting. Radiology 2274; [DATE].
3. Normal right ovary.  Satisfactory IUD position.

## 2020-04-14 IMAGING — CT CT RENAL STONE PROTOCOL
3 of 4 series · 8 of 46 positions shown, 15 images · non-contrast
Comparison: CT Abdomen and Pelvis 09/15/2013.

CLINICAL DATA: 28-year-old female with flank pain and painful
urination.

EXAM:
CT ABDOMEN AND PELVIS WITHOUT CONTRAST
TECHNIQUE: Multidetector CT imaging of the abdomen and pelvis was performed
following the standard protocol without IV contrast.

[Series 4: lung bases · axial · 0.67mm/px · z∈[-540,-480]mm · 4 of 21 slices shown, 9 images]
[im 5/21  soft-tissue]
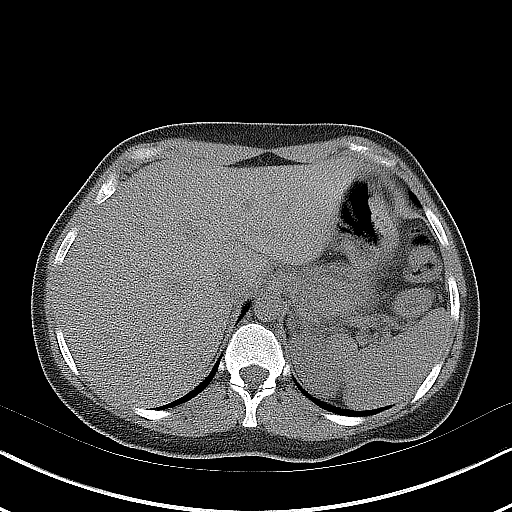
[im 5/21  lung]
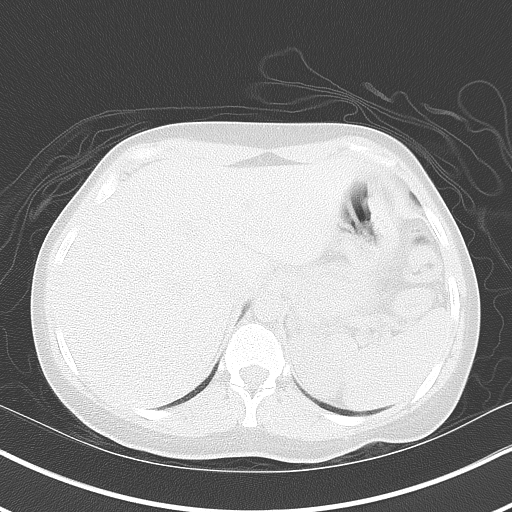
[im 5/21  bone]
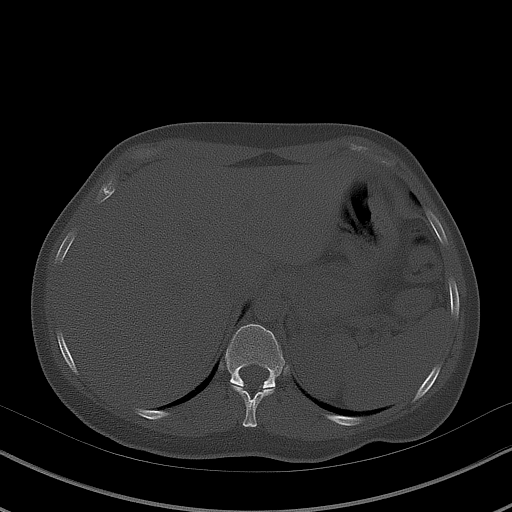
[im 9/21  soft-tissue]
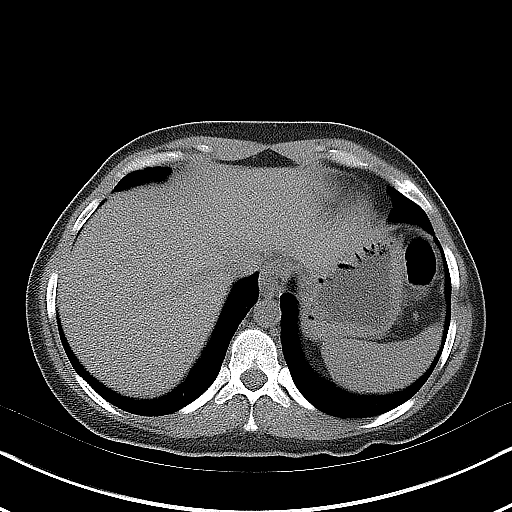
[im 9/21  lung]
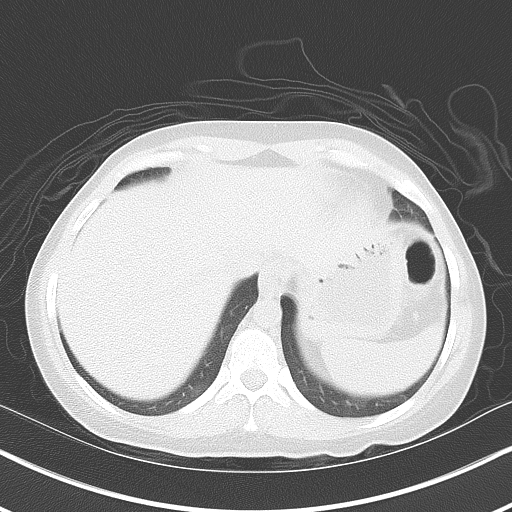
[im 13/21  soft-tissue]
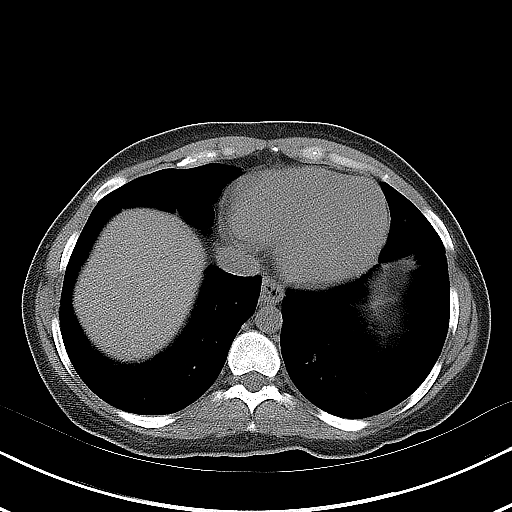
[im 13/21  lung]
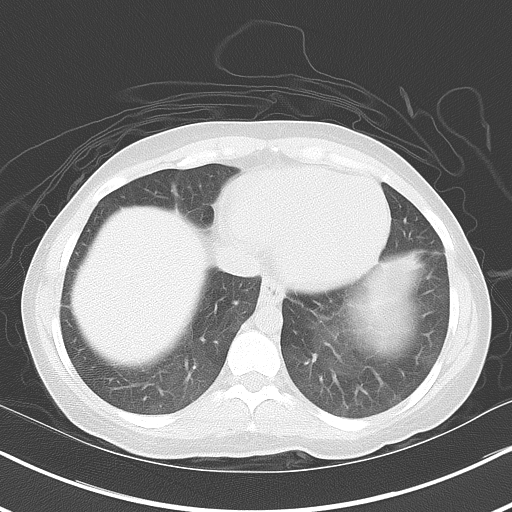
[im 17/21  soft-tissue]
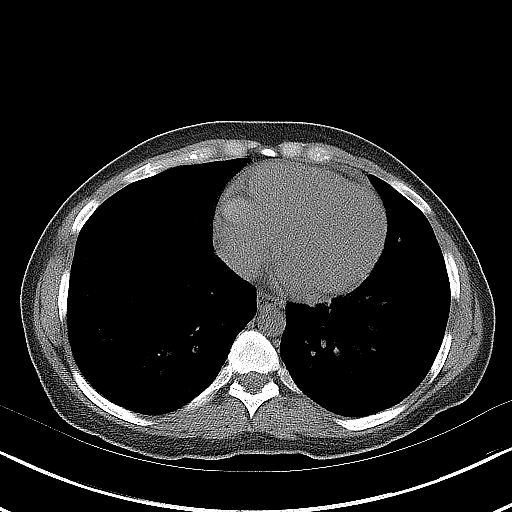
[im 17/21  lung]
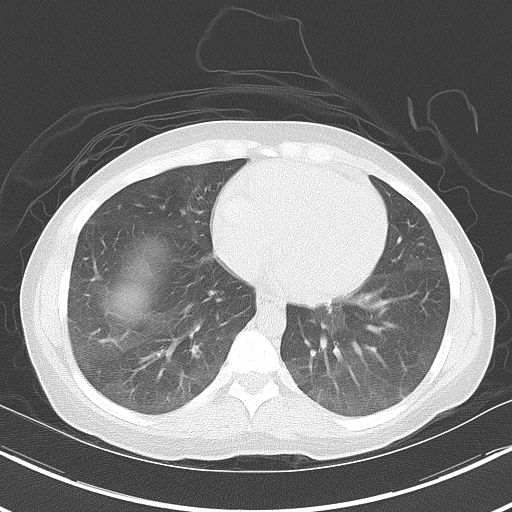

[Series 5: coronal · coronal · 0.67mm/px · 3 of 118 slices shown, 4 images]
[im 40/118  soft-tissue]
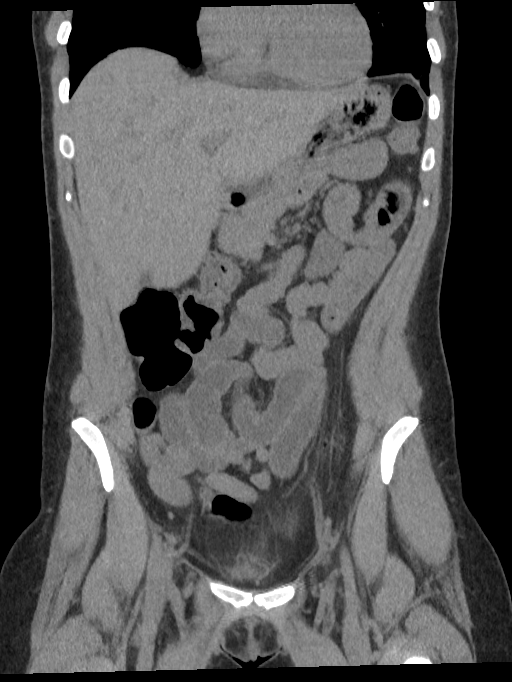
[im 53/118  soft-tissue]
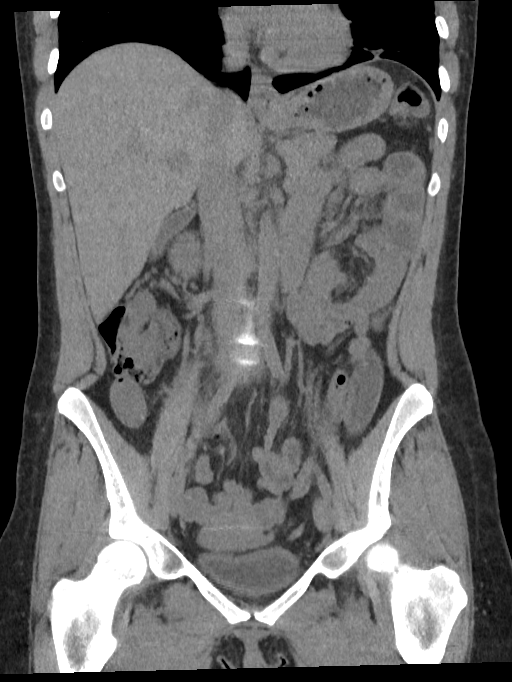
[im 53/118  bone]
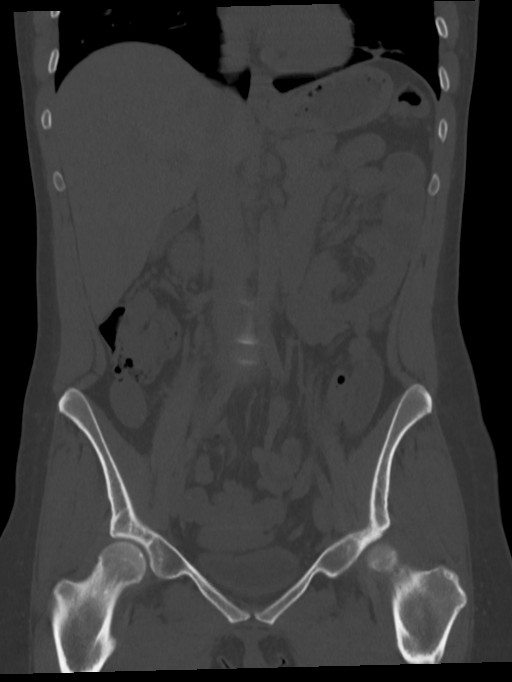
[im 66/118  soft-tissue]
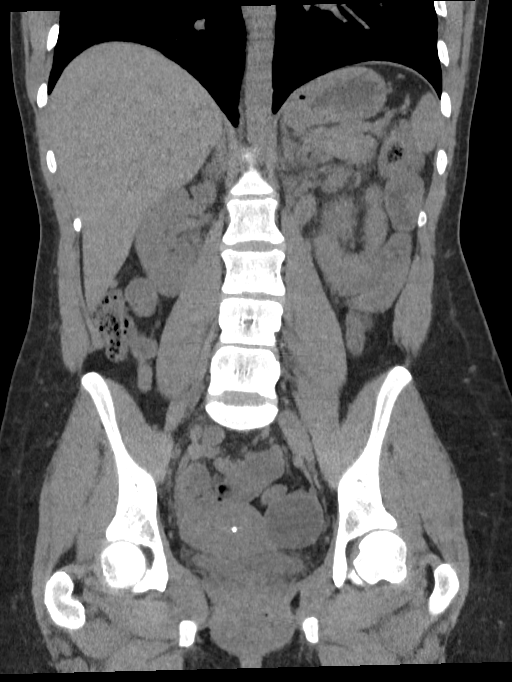

[Series 6: sagittal · sagittal · 0.46mm/px · 1 of 173 slices shown, 2 images]
[im 58/173  soft-tissue]
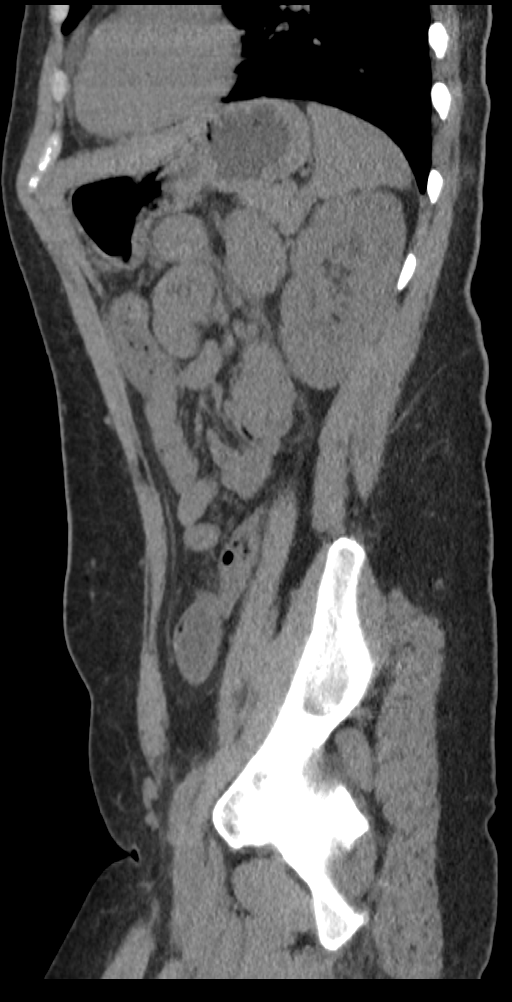
[im 58/173  bone]
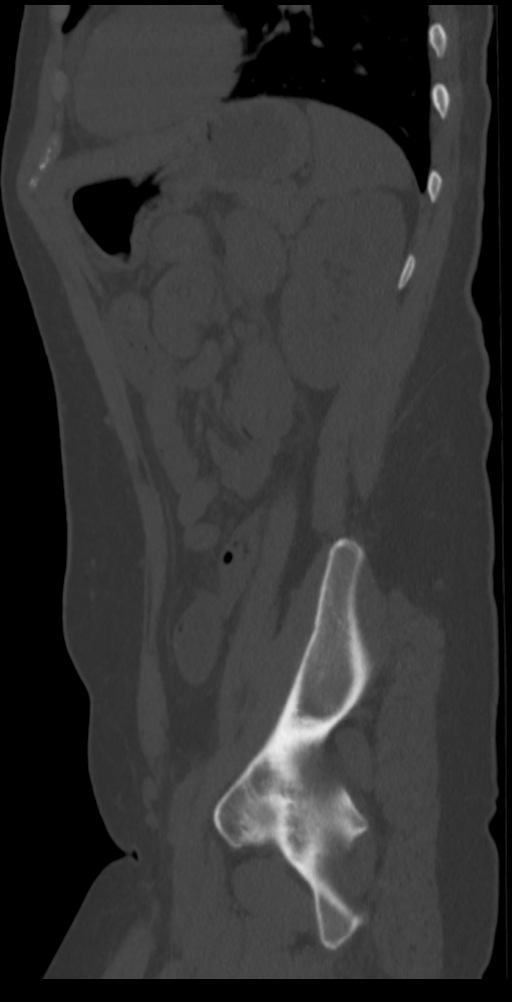

[8 of 46 positions shown; findings below may reference images not displayed]

FINDINGS: Lower chest: Minor mosaic attenuation in the lung bases similar to
that in 4669. No cardiomegaly, pericardial effusion or pleural
effusion.

Hepatobiliary: Negative noncontrast liver and gallbladder.

Pancreas: Negative.

Spleen: Negative.

Adrenals/Urinary Tract: Normal adrenal glands. No nephrolithiasis.
No perinephric stranding. No hydronephrosis. The proximal ureters
seem decompressed. No calculus along the course of either ureter.
Unchanged small right hemipelvis phleboliths since 4669 on series 2,
image 74. Diminutive and unremarkable urinary bladder.

Stomach/Bowel: Decompressed rectum. Redundant but otherwise negative
sigmoid colon. Decompressed descending colon. Negative transverse
colon. Negative right colon. Appendix tracks toward the inferior
liver margin from the cecum and is normal on coronal image 57.
Negative terminal ileum.

No dilated small bowel. Negative stomach. No free air, free fluid.

Vascular/Lymphatic: Vascular patency is not evaluated in the absence
of IV contrast. No lymphadenopathy.

Reproductive: IUD now in place. Negative noncontrast uterus and
right ovary. There is a 3.5 centimeter left ovarian cyst with simple
fluid density which is most likely physiologic.

Other: No pelvic free fluid.

Musculoskeletal: Osteitis condensans ilii since 4669, which is
typically asymptomatic. Otherwise negative.
IMPRESSION: 1. No urinary calculus or obstructive uropathy.
2. Physiologic appearing 3.5 cm left ovarian cyst. IUD also in
place.
3. No acute or inflammatory process identified in the non-contrast
abdomen or pelvis.

## 2021-05-23 ENCOUNTER — Emergency Department: Payer: Medicaid Other

## 2021-05-23 ENCOUNTER — Other Ambulatory Visit: Payer: Self-pay

## 2021-05-23 DIAGNOSIS — Z20822 Contact with and (suspected) exposure to covid-19: Secondary | ICD-10-CM | POA: Insufficient documentation

## 2021-05-23 DIAGNOSIS — J101 Influenza due to other identified influenza virus with other respiratory manifestations: Secondary | ICD-10-CM | POA: Insufficient documentation

## 2021-05-23 DIAGNOSIS — R059 Cough, unspecified: Secondary | ICD-10-CM | POA: Diagnosis present

## 2021-05-23 DIAGNOSIS — F17210 Nicotine dependence, cigarettes, uncomplicated: Secondary | ICD-10-CM | POA: Diagnosis not present

## 2021-05-23 LAB — BASIC METABOLIC PANEL
Anion gap: 7 (ref 5–15)
BUN: 6 mg/dL (ref 6–20)
CO2: 21 mmol/L — ABNORMAL LOW (ref 22–32)
Calcium: 8.3 mg/dL — ABNORMAL LOW (ref 8.9–10.3)
Chloride: 108 mmol/L (ref 98–111)
Creatinine, Ser: 0.71 mg/dL (ref 0.44–1.00)
GFR, Estimated: >60 mL/min (ref >60)
Glucose, Bld: 105 mg/dL — ABNORMAL HIGH (ref 70–99)
Potassium: 3.3 mmol/L — ABNORMAL LOW (ref 3.5–5.1)
Sodium: 136 mmol/L (ref 135–145)

## 2021-05-23 LAB — CBC
HCT: 42.6 % (ref 36.0–46.0)
Hemoglobin: 14 g/dL (ref 12.0–15.0)
MCH: 28.5 pg (ref 26.0–34.0)
MCHC: 32.9 g/dL (ref 30.0–36.0)
MCV: 86.8 fL (ref 80.0–100.0)
Platelets: 162 10*3/uL (ref 150–400)
RBC: 4.91 MIL/uL (ref 3.87–5.11)
RDW: 15 % (ref 11.5–15.5)
WBC: 10.7 10*3/uL — ABNORMAL HIGH (ref 4.0–10.5)
nRBC: 0 % (ref 0.0–0.2)

## 2021-05-23 LAB — TROPONIN I (HIGH SENSITIVITY)
Troponin I (High Sensitivity): 4 ng/L (ref <18)
Troponin I (High Sensitivity): 4 ng/L (ref <18)

## 2021-05-23 NOTE — ED Triage Notes (Signed)
Pt c/o lower CP, SOB, productive cough, nasal congestion, body aches, chills, and emesis that started yesterday. PT states she hasn't thrown up today. PT is AOX4, NAD noted. No cough noted at this time. Lung sounds clear bilaterally.

## 2021-05-24 ENCOUNTER — Emergency Department
Admission: EM | Admit: 2021-05-24 | Discharge: 2021-05-24 | Disposition: A | Discharge status: Home / Self Care | Payer: Medicaid Other | Attending: Emergency Medicine | Admitting: Emergency Medicine

## 2021-05-24 DIAGNOSIS — B349 Viral infection, unspecified: Secondary | ICD-10-CM

## 2021-05-24 LAB — RESP PANEL BY RT-PCR (FLU A&B, COVID) ARPGX2
Influenza A by PCR: POSITIVE — AB
Influenza B by PCR: NEGATIVE
SARS Coronavirus 2 by RT PCR: NEGATIVE

## 2021-05-24 LAB — HCG, QUANTITATIVE, PREGNANCY: hCG, Beta Chain, Quant, S: <1 m[IU]/mL (ref <5)

## 2021-05-24 LAB — POC URINE PREG, ED: Preg Test, Ur: NEGATIVE

## 2021-05-24 MED ORDER — POTASSIUM CHLORIDE CRYS ER 20 MEQ PO TBCR: 20.0000 meq | EXTENDED_RELEASE_TABLET | Freq: Every day | ORAL | 0 refills | Status: AC

## 2021-05-24 MED ORDER — ONDANSETRON 4 MG PO TBDP: 4.0000 mg | ORAL_TABLET | Freq: Three times a day (TID) | ORAL | 0 refills | Status: AC | PRN

## 2021-05-24 MED ORDER — BENZONATATE 100 MG PO CAPS: 100.0000 mg | ORAL_CAPSULE | Freq: Three times a day (TID) | ORAL | 0 refills | Status: AC | PRN

## 2021-05-24 MED ORDER — GUAIFENESIN-CODEINE 100-10 MG/5ML PO SYRP: 5.0000 mL | ORAL_SOLUTION | Freq: Three times a day (TID) | ORAL | 0 refills | Status: AC | PRN

## 2021-05-24 NOTE — ED Provider Notes (Signed)
[facility] Emergency Department Provider Note  ____________________________________________   Event Date/Time   First MD Initiated Contact with Patient 05/24/21 [phone]     (approximate)  I have reviewed the triage vital signs and the nursing notes.   HISTORY  Chief Complaint Chest Pain    HPI Lauren Carrillo is a 30 y.o. female who is otherwise healthy comes in with sickness starting yesterday.  Pt reports having runny nose, chest pain, coughing, headache, constant, vomiting, not better with nyquil, excedrin, motrin, nothing makes it worse. Some SOB as well. Whole house is sick. + family member with the flu.            Past Medical History:  Diagnosis Date   History of hiatal hernia     Patient Active Problem List   Diagnosis Date Noted   Bradycardia 05/07/2017   Suprvsn of high risk preg due to social problems, third tri 05/05/2017   Abdominal pain in pregnancy 02/14/2017   Labor and delivery indication for care or intervention 12/31/2016   Acute bronchitis 01/24/2015   Costochondritis 01/24/2015   Smoker 01/24/2015    Past Surgical History:  Procedure Laterality Date   NO PAST SURGERIES     WISDOM TOOTH EXTRACTION      Prior to Admission medications   Medication Sig Start Date End Date Taking? Authorizing Provider  hydrOXYzine (ATARAX/VISTARIL) 25 MG tablet Take 1 tablet (25 mg total) by mouth 3 (three) times daily as needed. 02/11/19   Fisher, Roselyn Bering, PA-C  predniSONE (STERAPRED UNI-PAK 21 TAB) 10 MG (21) TBPK tablet Take 6 pills on day one then decrease by 1 pill each day 02/11/19   Faythe Ghee, PA-C  Prenatal Vit-Fe Fumarate-FA (MULTIVITAMIN-PRENATAL) 27-0.8 MG TABS tablet Take 1 tablet by mouth daily at 12 noon.    [provider]  traMADol (ULTRAM) 50 MG tablet Take 1 tablet (50 mg total) by mouth every 6 (six) hours as needed. 12/10/19   Burgess Amor, PA-C  fluticasone (FLONASE) 50 MCG/ACT nasal spray Place 2 sprays  into both nostrils daily. Patient not taking: Reported on 12/31/2016 01/24/15 02/11/19  Saguier, Ramon Dredge, PA-C    Allergies Patient has no known allergies.  Family History  Problem Relation Age of Onset   Cancer Father        lung    Social History Social History   Tobacco Use   Smoking status: Every Day    Packs/day: 1.00    Years: 4.00    Pack years: 4.00    Types: Cigars, Cigarettes   Smokeless tobacco: Never  Substance Use Topics   Alcohol use: Yes    Comment: rare 1-2 times a year.   Drug use: Yes    Frequency: 7.0 times per week    Types: Marijuana    Comment: every day      Review of Systems Constitutional: Positive fever Eyes: No visual changes. ENT: No sore throat. Cardiovascular: Positive chest pain Respiratory: Shortness of breath, cough Gastrointestinal: No abdominal pain.  No nausea, no vomiting.  No diarrhea.  No constipation. Genitourinary: Negative for dysuria. Musculoskeletal: Negative for back pain. Skin: Negative for rash. Neurological: Headache, no focal weakness or numbness. All other ROS negative ____________________________________________   PHYSICAL EXAM:  VITAL SIGNS: ED Triage Vitals  Enc Vitals Group     BP 05/23/21 2026 102/73     Pulse Rate 05/23/21 2026 92     Resp 05/23/21 2026 (!) 22     Temp 05/23/21  2026 99.2 F (37.3 C)     Temp Source 05/23/21 2026 Oral     SpO2 05/23/21 2026 95 %     Weight 05/23/21 2026 140 lb (63.5 kg)     Height 05/23/21 2026 5\' 1"  (1.549 m)     Head Circumference --      Peak Flow --      Pain Score 05/23/21 2025 1     Pain Loc --      Pain Edu? --      Excl. in [place]? --     Constitutional: Alert and oriented. Well appearing and in no acute distress. Eyes: Conjunctivae are normal. EOMI. Head: Atraumatic. Nose: No congestion/rhinnorhea. Mouth/Throat: Mucous membranes are moist.   Neck: No stridor. Trachea Midline. FROM Cardiovascular: Normal rate, regular rhythm. Grossly normal heart sounds.   Good peripheral circulation. Respiratory: Normal respiratory effort.  No retractions. Lungs CTAB. Gastrointestinal: Soft and nontender. No distention. No abdominal bruits.  Musculoskeletal: No lower extremity tenderness nor edema.  No joint effusions. Neurologic:  Normal speech and language. No gross focal neurologic deficits are appreciated.  Skin:  Skin is warm, dry and intact. No rash noted. Psychiatric: Mood and affect are normal. Speech and behavior are normal. GU: Deferred   ____________________________________________   LABS (all labs ordered are listed, but only abnormal results are displayed)  Labs Reviewed  BASIC METABOLIC PANEL - Abnormal; Notable for the following components:      Result Value   Potassium 3.3 (*)    CO2 21 (*)    Glucose, Bld 105 (*)    Calcium 8.3 (*)    All other components within normal limits  CBC - Abnormal; Notable for the following components:   WBC 10.7 (*)    All other components within normal limits  HCG, QUANTITATIVE, PREGNANCY  POC URINE PREG, ED  TROPONIN I (HIGH SENSITIVITY)  TROPONIN I (HIGH SENSITIVITY)   ____________________________________________   ED ECG REPORT I, Vanessa Flowella, the attending physician, personally viewed and interpreted this ECG.  Normal sinus, no st elevation, no twi, normal intervals  ____________________________________________  RADIOLOGY Robert Bellow, personally viewed and evaluated these images (plain radiographs) as part of my medical decision making, as well as reviewing the written report by the radiologist.  ED MD interpretation:  no pna   Official radiology report(s): DG Chest 2 View  Result Date: 05/23/2021 CLINICAL DATA:  Chest pain, cough EXAM: CHEST - 2 VIEW COMPARISON:  01/20/2015 FINDINGS: Lungs are clear.  No pleural effusion or pneumothorax. The heart is normal in size. Visualized osseous structures are within normal limits. IMPRESSION: Normal chest radiographs. Electronically Signed    By: Julian Hy M.D.   On: 05/23/2021 20:51    ____________________________________________   PROCEDURES  Procedure(s) performed (including Critical Care):  Procedures   ____________________________________________   INITIAL IMPRESSION / ASSESSMENT AND PLAN / ED COURSE   Lauren Carrillo was evaluated in Emergency Department on 05/24/2021 for the symptoms described in the history of present illness. She was evaluated in the context of the global COVID-19 pandemic, which necessitated consideration that the patient might be at risk for infection with the SARS-CoV-2 virus that causes COVID-19. Institutional protocols and algorithms that pertain to the evaluation of patients at risk for COVID-19 are in a state of rapid change based on information released by regulatory bodies including the CDC and federal and state organizations. These policies and algorithms were followed during the patient's care in the ED.  Most Likely DDx:  -symptoms sound like flu vs covid. Ekg/cardiac markers done for ACS for triage.        DDx that was also considered d/t potential to cause harm, but was found less likely based on history and physical (as detailed above): -PNA (no fevers, cough but CXR to evaluate) -PNX (reassured with equal b/l breath sounds, CXR to evaluate) -Symptomatic anemia (will get H&H) -Pulmonary embolism as no sob at rest, not pleuritic in nature, no hypoxia -Aortic Dissection as no tearing pain and no radiation to the mid back, pulses equal -Pericarditis no rub on exam, EKG changes or hx to suggest dx -Tamponade (no notable SOB, tachycardic, hypotensive) -Esophageal rupture (no h/o diffuse vomitting/no crepitus) No belly pain to suggest gallbladder path.    Preg test negative Trop negative x2 Potassium 3.3 otherwise reassuring  No burning when she pees.   Sats 93% even with walking. HR 105.     Patient was seen from triage given no rooms available.  Heart rates  are slightly elevated.  Offered to wait for COVID, flu test come back versus giving some fluids patient's been tolerating p.o. and would like to go home.  Patient declines Tamiflu treatment.  We will treat symptomatically with Tylenol, ibuprofen, codeine cough syrup and she understands not to drive on this, Tessalon Perles and Zofran.  Patient's been tolerating p.o.  Amatory sat was 93%.  She understands that if she develops worsening shortness of breath that she needs to return to the ER for further evaluation for oxygen.      ____________________________________________   FINAL CLINICAL IMPRESSION(S) / ED DIAGNOSES   Final diagnoses:  Viral illness     MEDICATIONS GIVEN DURING THIS VISIT:  Medications - No data to display   ED Discharge Orders     None        Note:  This document was prepared using Dragon voice recognition software and may include unintentional dictation errors.    Vanessa [place], MD 05/24/21 [phone]

## 2021-05-24 NOTE — Discharge Instructions (Signed)
Most likely viral illness. F/u Mychart for results   Tylenol 1g every 8 hours, ibuprofen 600mg  6-8 hours with food.  Return for SOB, given oxygen level borderline low.   Take oxycodone as prescribed. Do not drink alcohol, drive or participate in any other potentially dangerous activities while taking this medication as it may make you sleepy. Do not take this medication with any other sedating medications, either prescription or over-the-counter. If you were prescribed Percocet or Vicodin, do not take these with acetaminophen (Tylenol) as it is already contained within these medications.  This medication is an opiate (or narcotic) pain medication and can be habit forming. Use it as little as possible to achieve adequate pain control. Do not use or use it with extreme caution if you have a history of opiate abuse or dependence. If you are on a pain contract with your primary care doctor or a pain specialist, be sure to let them know you were prescribed this medication today from the Emergency Department. This medication is intended for your use only - do not give any to anyone else and keep it in a secure place where nobody else, especially children, have access to it.

## 2022-06-11 IMAGING — CR DG CHEST 2V
1 series · 2 of 2 positions shown · non-contrast
Comparison: 01/20/2015

CLINICAL DATA: Chest pain, cough

EXAM:
CHEST - 2 VIEW

[Series 1: dg chest 2 view · 0.14mm/px · 2 of 2 slices shown]
[im 1/2]
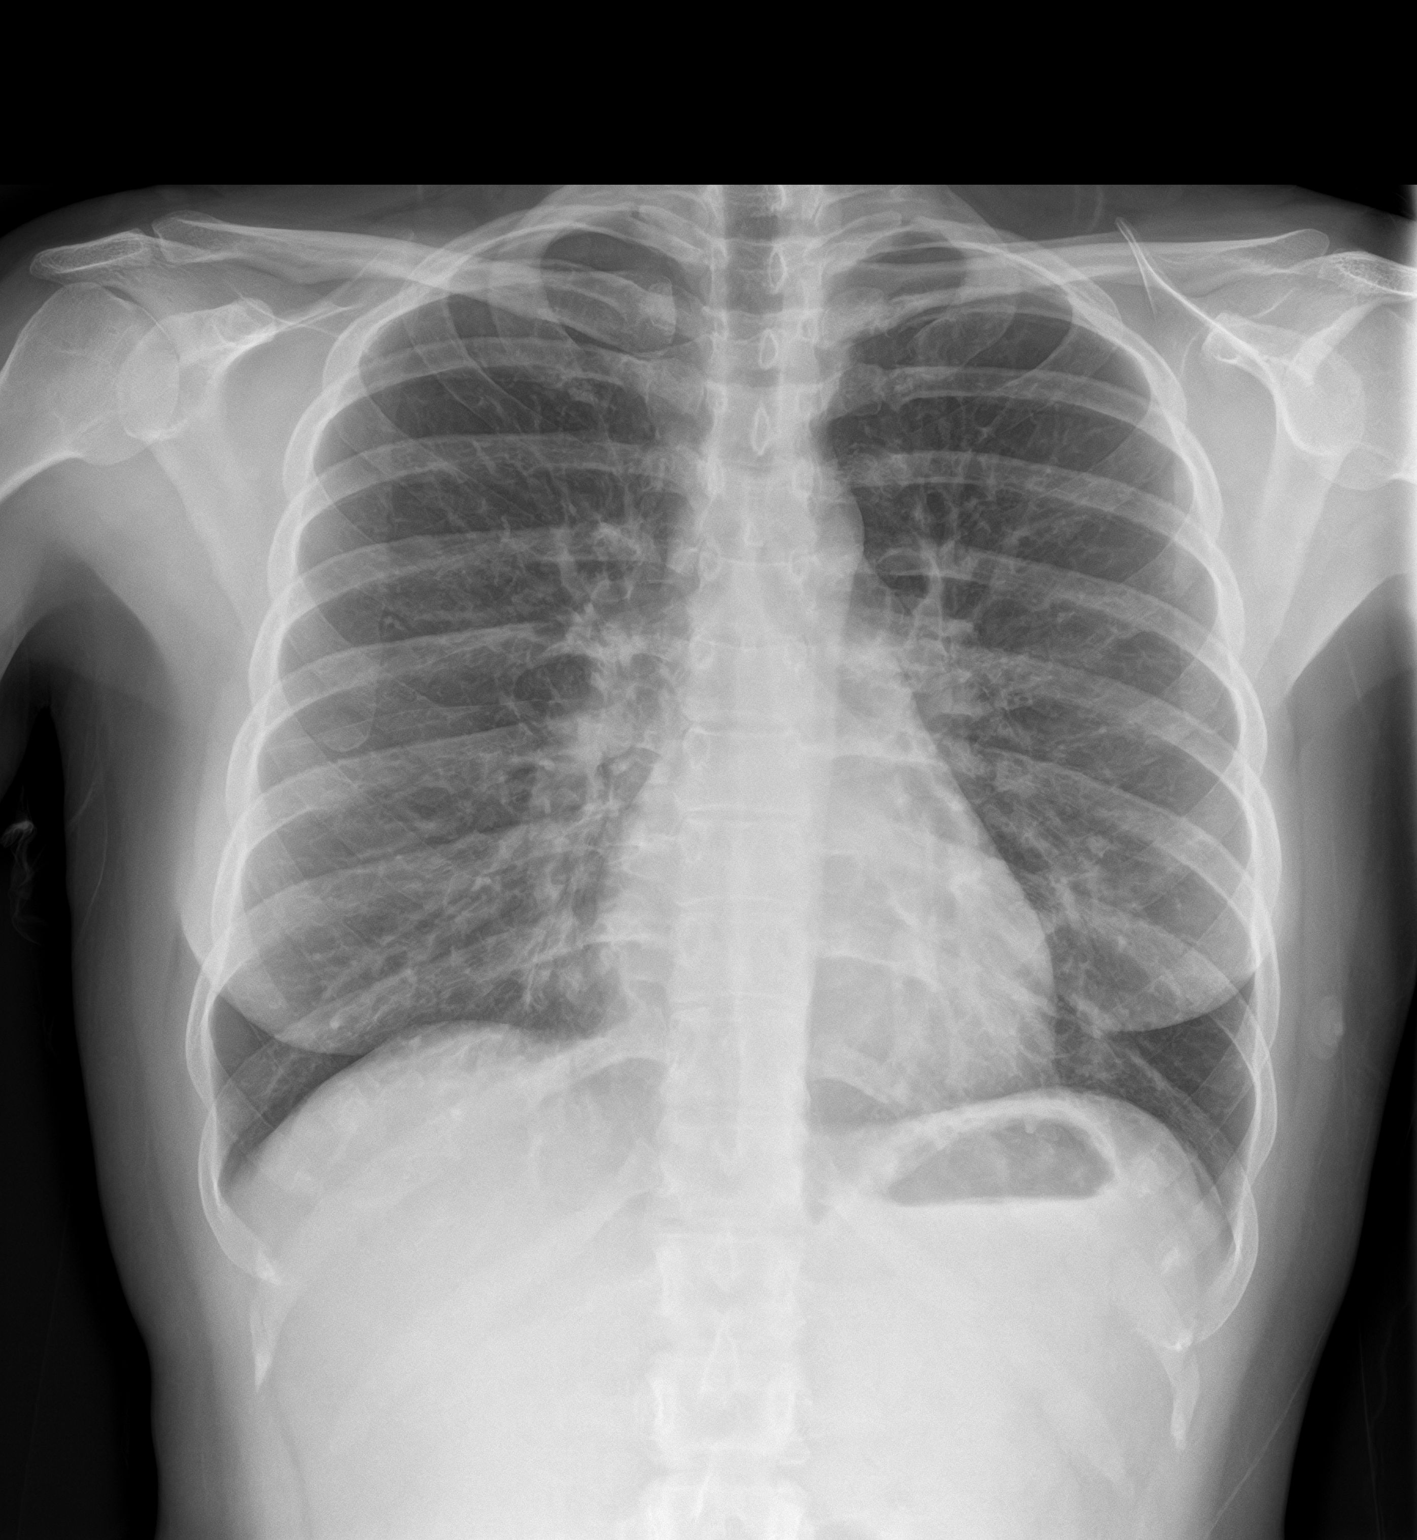
[im 2/2]
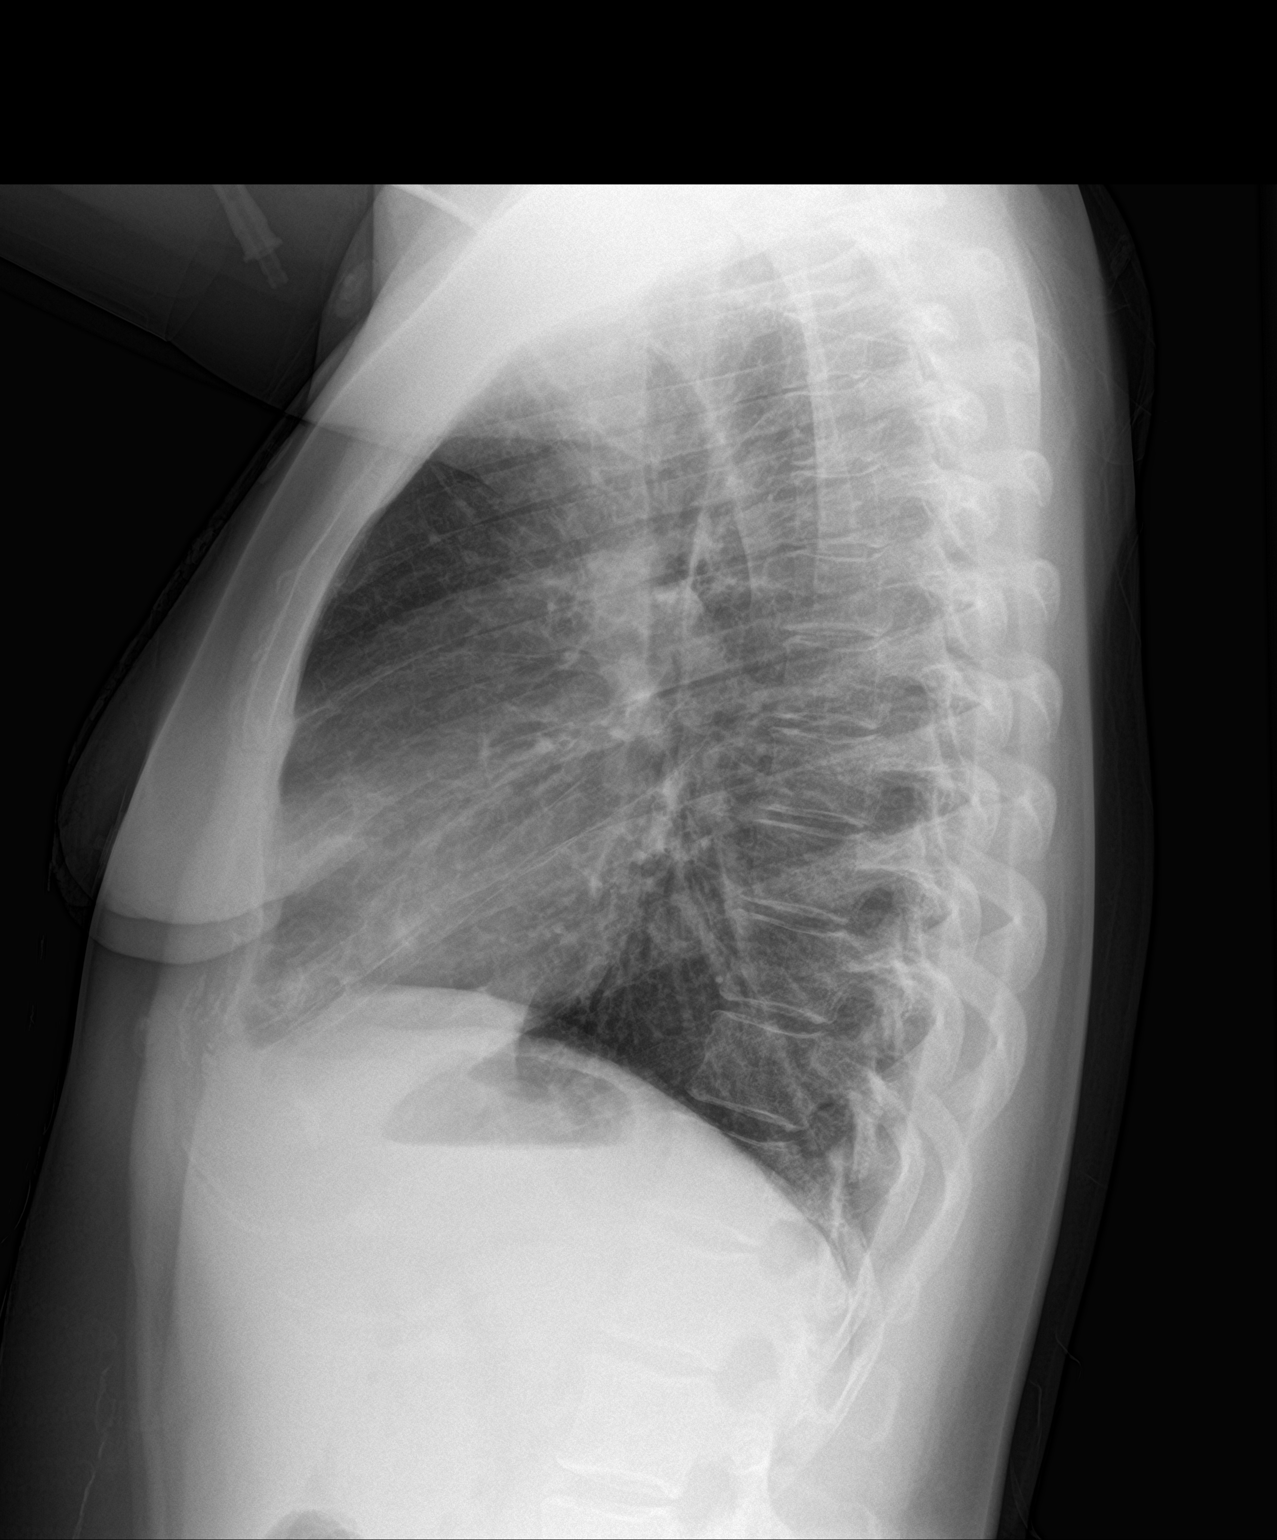

[2 of 2 positions shown; findings below may reference images not displayed]

FINDINGS: Lungs are clear.  No pleural effusion or pneumothorax.

The heart is normal in size.

Visualized osseous structures are within normal limits.
IMPRESSION: Normal chest radiographs.

## 2022-06-21 ENCOUNTER — Ambulatory Visit
Admission: EM | Admit: 2022-06-21 | Discharge: 2022-06-21 | Disposition: A | Discharge status: Home / Self Care | Payer: Medicaid Other | Attending: Emergency Medicine | Admitting: Emergency Medicine

## 2022-06-21 DIAGNOSIS — Z113 Encounter for screening for infections with a predominantly sexual mode of transmission: Secondary | ICD-10-CM

## 2022-06-21 DIAGNOSIS — R300 Dysuria: Secondary | ICD-10-CM | POA: Diagnosis not present

## 2022-06-21 LAB — POCT URINALYSIS DIP (MANUAL ENTRY)
Glucose, UA: NEGATIVE mg/dL
Nitrite, UA: NEGATIVE
Protein Ur, POC: 100 mg/dL — AB
Spec Grav, UA: >=1.03 — AB (ref 1.010–1.025)
Urobilinogen, UA: 0.2 E.U./dL
pH, UA: 6 (ref 5.0–8.0)

## 2022-06-21 LAB — POCT URINE PREGNANCY: Preg Test, Ur: NEGATIVE

## 2022-06-21 MED ORDER — CEPHALEXIN 500 MG PO CAPS: 500.0000 mg | ORAL_CAPSULE | Freq: Two times a day (BID) | ORAL | 0 refills | Status: AC

## 2022-06-21 NOTE — ED Triage Notes (Signed)
Pt presents with dysuria x 4-5 days.  Denies abdominal pain, unsure of hematuria (starting period).  No discharge, rash, fevers.

## 2022-06-21 NOTE — Discharge Instructions (Addendum)
Take the antibiotic as directed.  The urine culture is pending.  We will call you if it shows the need to change or discontinue your antibiotic.    Your vaginal tests are pending.  If your test results are positive, we will call you.  You may require treatment at that time.  Do not have sexual activity for at least 7 days.    Follow up with your primary care provider if your symptoms are not improving.

## 2022-06-21 NOTE — ED Provider Notes (Signed)
Roderic Palau    CSN: [id number] Arrival date & time: 06/21/22  0913      History   Chief Complaint Chief Complaint  Patient presents with   Dysuria    HPI Lauren Carrillo is a 31 y.o. female.  Patient presents with dysuria x 4 days.  She denies fever, abdominal pain, flank pain, hematuria, vaginal discharge, pelvic pain, or other symptoms.  No treatments at home.  She reports that oral intake of water.  She also requests STD testing.  She denies current pregnancy or breastfeeding.    The history is provided by the patient and medical records.    Past Medical History:  Diagnosis Date   History of hiatal hernia     Patient Active Problem List   Diagnosis Date Noted   Bradycardia 05/07/2017   Suprvsn of high risk preg due to social problems, third tri 05/05/2017   Abdominal pain in pregnancy 02/14/2017   Labor and delivery indication for care or intervention 12/31/2016   Acute bronchitis 01/24/2015   Costochondritis 01/24/2015   Smoker 01/24/2015    Past Surgical History:  Procedure Laterality Date   NO PAST SURGERIES     WISDOM TOOTH EXTRACTION      OB History     Gravida  2   Para  2   Term  2   Preterm      AB      Living  2      SAB      IAB      Ectopic      Multiple  0   Live Births  2            Home Medications    Prior to Admission medications   Medication Sig Start Date End Date Taking? Authorizing Provider  cephALEXin (KEFLEX) 500 MG capsule Take 1 capsule (500 mg total) by mouth 2 (two) times daily for 5 days. 06/21/22 06/26/22 Yes Sharion Balloon, NP  hydrOXYzine (ATARAX/VISTARIL) 25 MG tablet Take 1 tablet (25 mg total) by mouth 3 (three) times daily as needed. 02/11/19   Caryn Section Linden Dolin, PA-C  ondansetron (ZOFRAN ODT) 4 MG disintegrating tablet Take 1 tablet (4 mg total) by mouth every 8 (eight) hours as needed for nausea or vomiting. 05/24/21   Vanessa [place], MD  potassium chloride SA (KLOR-CON) 20 MEQ tablet  Take 1 tablet (20 mEq total) by mouth daily for 3 days. 05/24/21 05/27/21  Vanessa Drumright, MD  predniSONE (STERAPRED UNI-PAK 21 TAB) 10 MG (21) TBPK tablet Take 6 pills on day one then decrease by 1 pill each day 02/11/19   Versie Starks, PA-C  Prenatal Vit-Fe Fumarate-FA (MULTIVITAMIN-PRENATAL) 27-0.8 MG TABS tablet Take 1 tablet by mouth daily at 12 noon.    [provider]  traMADol (ULTRAM) 50 MG tablet Take 1 tablet (50 mg total) by mouth every 6 (six) hours as needed. 12/10/19   Evalee Jefferson, PA-C  fluticasone (FLONASE) 50 MCG/ACT nasal spray Place 2 sprays into both nostrils daily. Patient not taking: Reported on 12/31/2016 01/24/15 02/11/19  Saguier, Percell Miller, PA-C    Family History Family History  Problem Relation Age of Onset   Cancer Father        lung    Social History Social History   Tobacco Use   Smoking status: Every Day    Packs/day: 1.00    Years: 4.00    Total pack years: 4.00    Types: Cigars, Cigarettes  Smokeless tobacco: Never  Substance Use Topics   Alcohol use: Not Currently    Comment: rare 1-2 times a year.   Drug use: Not Currently    Frequency: 7.0 times per week    Types: Marijuana    Comment: every day     Allergies   Patient has no known allergies.   Review of Systems Review of Systems  Constitutional:  Negative for chills and fever.  Gastrointestinal:  Negative for abdominal pain, diarrhea, nausea and vomiting.  Genitourinary:  Positive for dysuria. Negative for flank pain, hematuria, pelvic pain and vaginal discharge.  Skin:  Negative for color change and rash.  All other systems reviewed and are negative.    Physical Exam Triage Vital Signs ED Triage Vitals  Enc Vitals Group     BP 06/21/22 1030 116/67     Pulse Rate 06/21/22 1030 69     Resp 06/21/22 1030 20     Temp 06/21/22 1030 99.1 F (37.3 C)     Temp Source 06/21/22 1030 Oral     SpO2 06/21/22 1030 96 %     Weight --      Height --      Head Circumference --       Peak Flow --      Pain Score 06/21/22 1027 1     Pain Loc --      Pain Edu? --      Excl. in [place]? --    No data found.  Updated Vital Signs BP 116/67 (BP Location: Left Arm)   Pulse 69   Temp 99.1 F (37.3 C) (Oral)   Resp 20   LMP 06/21/2022 (Exact Date)   SpO2 96%   Visual Acuity Right Eye Distance:   Left Eye Distance:   Bilateral Distance:    Right Eye Near:   Left Eye Near:    Bilateral Near:     Physical Exam Vitals and nursing note reviewed.  Constitutional:      General: She is not in acute distress.    Appearance: Normal appearance. She is well-developed. She is not ill-appearing.  HENT:     Mouth/Throat:     Mouth: Mucous membranes are moist.  Cardiovascular:     Rate and Rhythm: Normal rate and regular rhythm.     Heart sounds: Normal heart sounds.  Pulmonary:     Effort: Pulmonary effort is normal. No respiratory distress.     Breath sounds: Normal breath sounds.  Abdominal:     General: Bowel sounds are normal.     Palpations: Abdomen is soft.     Tenderness: There is no abdominal tenderness. There is no right CVA tenderness, left CVA tenderness, guarding or rebound.  Musculoskeletal:     Cervical back: Neck supple.  Skin:    General: Skin is warm and dry.  Neurological:     Mental Status: She is alert.  Psychiatric:        Mood and Affect: Mood normal.        Behavior: Behavior normal.      UC Treatments / Results  Labs (all labs ordered are listed, but only abnormal results are displayed) Labs Reviewed  POCT URINALYSIS DIP (MANUAL ENTRY) - Abnormal; Notable for the following components:      Result Value   Bilirubin, UA small (*)    Ketones, POC UA moderate (40) (*)    Spec Grav, UA >=1.030 (*)    Blood, UA moderate (*)  Protein Ur, POC =100 (*)    Leukocytes, UA Trace (*)    All other components within normal limits  URINE CULTURE  POCT URINE PREGNANCY  CERVICOVAGINAL ANCILLARY ONLY    EKG   Radiology No results  found.  Procedures Procedures (including critical care time)  Medications Ordered in UC Medications - No data to display  Initial Impression / Assessment and Plan / UC Course  I have reviewed the triage vital signs and the nursing notes.  Pertinent labs & imaging results that were available during my care of the patient were reviewed by me and considered in my medical decision making (see chart for details).    Dysuria, STD screening.  Treating with Keflex. Urine culture pending. Discussed with patient that we will call her if the urine culture shows the need to change or discontinue the antibiotic. Patient obtained self swab for testing.  Discussed that we will call if test results are positive.  Discussed that she may require treatment at that time.  Instructed patient to abstain from sexual activity for at least 7 days.  Instructed her to follow-up with her PCP or gynecologist if her symptoms are not improving.  Patient agrees to plan of care.      Final Clinical Impressions(s) / UC Diagnoses   Final diagnoses:  Screening for STD (sexually transmitted disease)  Dysuria     Discharge Instructions      Take the antibiotic as directed.  The urine culture is pending.  We will call you if it shows the need to change or discontinue your antibiotic.    Your vaginal tests are pending.  If your test results are positive, we will call you.  You may require treatment at that time.  Do not have sexual activity for at least 7 days.    Follow up with your primary care provider if your symptoms are not improving.          ED Prescriptions     Medication Sig Dispense Auth. Provider   cephALEXin (KEFLEX) 500 MG capsule Take 1 capsule (500 mg total) by mouth 2 (two) times daily for 5 days. 10 capsule Mickie Bail, NP      PDMP not reviewed this encounter.   Mickie Bail, NP 06/21/22 [phone]

## 2022-06-22 ENCOUNTER — Telehealth (HOSPITAL_COMMUNITY): Payer: Self-pay | Admitting: Emergency Medicine

## 2022-06-22 LAB — CERVICOVAGINAL ANCILLARY ONLY
Bacterial Vaginitis (gardnerella): POSITIVE — AB
Candida Glabrata: NEGATIVE
Candida Vaginitis: NEGATIVE
Chlamydia: NEGATIVE
Comment: NEGATIVE
Comment: NEGATIVE
Comment: NEGATIVE
Comment: NEGATIVE
Comment: NEGATIVE
Comment: NORMAL
Neisseria Gonorrhea: NEGATIVE
Trichomonas: POSITIVE — AB

## 2022-06-22 MED ORDER — METRONIDAZOLE 500 MG PO TABS: 500.0000 mg | ORAL_TABLET | Freq: Two times a day (BID) | ORAL | 0 refills | Status: AC

## 2022-06-23 LAB — URINE CULTURE: Culture: 80000 — AB

## 2022-06-29 ENCOUNTER — Ambulatory Visit: Payer: Self-pay

## 2022-09-13 ENCOUNTER — Encounter: Payer: Self-pay | Admitting: Emergency Medicine

## 2022-09-13 ENCOUNTER — Emergency Department
Admission: EM | Admit: 2022-09-13 | Discharge: 2022-09-13 | Disposition: A | Discharge status: Home / Self Care | Payer: Medicaid Other | Attending: Emergency Medicine | Admitting: Emergency Medicine

## 2022-09-13 DIAGNOSIS — Z87891 Personal history of nicotine dependence: Secondary | ICD-10-CM | POA: Diagnosis not present

## 2022-09-13 DIAGNOSIS — K0889 Other specified disorders of teeth and supporting structures: Secondary | ICD-10-CM | POA: Insufficient documentation

## 2022-09-13 MED ORDER — AMOXICILLIN-POT CLAVULANATE 875-125 MG PO TABS
1.0000 | ORAL_TABLET | Freq: Once | ORAL | Status: AC
  Administered 2022-09-13: 1 via ORAL
  Filled 2022-09-13: qty 1

## 2022-09-13 MED ORDER — AMOXICILLIN-POT CLAVULANATE 875-125 MG PO TABS: 1.0000 | ORAL_TABLET | Freq: Two times a day (BID) | ORAL | 0 refills | Status: AC

## 2022-09-13 MED ORDER — OXYCODONE HCL 5 MG PO TABS: 5.0000 mg | ORAL_TABLET | Freq: Three times a day (TID) | ORAL | 0 refills | Status: AC | PRN

## 2022-09-13 MED ORDER — OXYCODONE HCL 5 MG PO TABS
5.0000 mg | ORAL_TABLET | Freq: Once | ORAL | Status: AC
  Administered 2022-09-13: 5 mg via ORAL
  Filled 2022-09-13: qty 1

## 2022-09-13 MED ORDER — IBUPROFEN 600 MG PO TABS
600.0000 mg | ORAL_TABLET | Freq: Once | ORAL | Status: AC
  Administered 2022-09-13: 600 mg via ORAL
  Filled 2022-09-13: qty 1

## 2022-09-13 MED ORDER — LIDOCAINE VISCOUS HCL 2 % MT SOLN
15.0000 mL | Freq: Once | OROMUCOSAL | Status: AC
Start: 2022-09-13 — End: 2022-09-13
  Administered 2022-09-13: 15 mL via OROMUCOSAL
  Filled 2022-09-13: qty 15

## 2022-09-13 MED ORDER — ACETAMINOPHEN 500 MG PO TABS
1000.0000 mg | ORAL_TABLET | Freq: Once | ORAL | Status: AC
  Administered 2022-09-13: 1000 mg via ORAL
  Filled 2022-09-13: qty 2

## 2022-09-13 NOTE — Discharge Instructions (Signed)
Take acetaminophen 650 mg and ibuprofen 400 mg every 6 hours for pain.  Take with food. Oxycodone for severe/breakthrough pain as prescribed only as needed. Take Augmentin antibiotic for the full course as prescribed.  Call your dentist in the morning for an appointment this week.

## 2022-09-13 NOTE — ED Triage Notes (Signed)
Pt presents ambulatory to triage via POV with complaints of L upper and lower dental pain starting yesterday. She notes taking Ibuprofen without any improvement in her sx. Pt endorses poor dental hygiene. A&Ox4 at this time. Denies CP or SOB.

## 2022-09-13 NOTE — ED Provider Notes (Signed)
[facility] Provider Note    Event Date/Time   First MD Initiated Contact with Patient 09/13/22 2009     (approximate)   History   Dental Pain   HPI  Lauren Carrillo is a 32 y.o. female   Past medical history of smoker, bronchitis, here with left bottom molar fractured tooth from years ago started hurting yesterday no systemic infectious symptoms, no loose teeth and no new injuries.  Denies any other acute medical complaints.   External Medical Documents Reviewed: Emergency department visit dated 06/21/22 diagnosed with a UTI      Physical Exam   Triage Vital Signs: ED Triage Vitals  Enc Vitals Group     BP 09/13/22 1918 117/67     Pulse Rate 09/13/22 1918 60     Resp 09/13/22 1918 18     Temp 09/13/22 1918 98.1 F (36.7 C)     Temp Source 09/13/22 1918 Oral     SpO2 09/13/22 1918 100 %     Weight 09/13/22 1918 130 lb (59 kg)     Height 09/13/22 1918 '5\' 1"'$  (1.549 m)     Head Circumference --      Peak Flow --      Pain Score 09/13/22 1919 8     Pain Loc --      Pain Edu? --      Excl. in [place]? --     Most recent vital signs: Vitals:   09/13/22 1918  BP: 117/67  Pulse: 60  Resp: 18  Temp: 98.1 F (36.7 C)  SpO2: 100%    General: Awake, no distress.  CV:  Good peripheral perfusion.  Resp:  Normal effort.  Abd:  No distention.  Other:  There is a fractured tooth left lower molar with no adjacent abscess or obvious infectious changes no loose teeth no malocclusion cervical lymphadenopathy or facial swelling.  Patient otherwise appears well normal vital signs afebrile nontoxic-appearing neck supple with full range of motion.   ED Results / Procedures / Treatments   Labs (all labs ordered are listed, but only abnormal results are displayed) Labs Reviewed - No data to display    PROCEDURES:  Critical Care performed: No  Procedures   MEDICATIONS ORDERED IN ED: Medications  acetaminophen (TYLENOL) tablet 1,000 mg  (1,000 mg Oral Given 09/13/22 2100)  ibuprofen (ADVIL) tablet 600 mg (600 mg Oral Given 09/13/22 2100)  oxyCODONE (Oxy IR/ROXICODONE) immediate release tablet 5 mg (5 mg Oral Given 09/13/22 2100)  amoxicillin-clavulanate (AUGMENTIN) 875-125 MG per tablet 1 tablet (1 tablet Oral Given 09/13/22 2100)  lidocaine (XYLOCAINE) 2 % viscous mouth solution 15 mL (15 mLs Mouth/Throat Given 09/13/22 2101)    IMPRESSION / MDM / ASSESSMENT AND PLAN / ED COURSE  I reviewed the triage vital signs and the nursing notes.                                Patient's presentation is most consistent with acute presentation with potential threat to life or bodily function.  Differential diagnosis includes, but is not limited to, dental infection, facial abscess, sepsis less likely   The patient is on the cardiac monitor to evaluate for evidence of arrhythmia and/or significant heart rate changes.  MDM: Patient with longstanding fractured tooth now painful to the area benign exam well-appearing patient normal vital signs will give Augmentin for coverage of early dental infection, pain control, and  she has establish care with a dentist who she will follow-up with the morning.  I offered an injection but she refused.         FINAL CLINICAL IMPRESSION(S) / ED DIAGNOSES   Final diagnoses:  Pain, dental     Rx / DC Orders   ED Discharge Orders          Ordered    oxyCODONE (ROXICODONE) 5 MG immediate release tablet  Every 8 hours PRN        09/13/22 2025    amoxicillin-clavulanate (AUGMENTIN) 875-125 MG tablet  2 times daily        09/13/22 2025             Note:  This document was prepared using Dragon voice recognition software and may include unintentional dictation errors.    Lucillie Garfinkel, MD 09/13/22 2107

## 2022-09-30 ENCOUNTER — Encounter: Payer: Self-pay | Admitting: Emergency Medicine

## 2022-09-30 ENCOUNTER — Emergency Department
Admission: EM | Admit: 2022-09-30 | Discharge: 2022-09-30 | Disposition: A | Discharge status: Home / Self Care | Payer: Medicaid Other | Attending: Emergency Medicine | Admitting: Emergency Medicine

## 2022-09-30 DIAGNOSIS — J029 Acute pharyngitis, unspecified: Secondary | ICD-10-CM | POA: Diagnosis present

## 2022-09-30 DIAGNOSIS — J020 Streptococcal pharyngitis: Secondary | ICD-10-CM | POA: Diagnosis not present

## 2022-09-30 LAB — GROUP A STREP BY PCR: Group A Strep by PCR: NOT DETECTED

## 2022-09-30 MED ORDER — CEPHALEXIN 500 MG PO CAPS
500.0000 mg | ORAL_CAPSULE | Freq: Two times a day (BID) | ORAL | 0 refills | Status: AC
Start: 2022-09-30 — End: ?

## 2022-09-30 MED ORDER — CEPHALEXIN 500 MG PO CAPS
500.0000 mg | ORAL_CAPSULE | Freq: Once | ORAL | Status: AC
  Administered 2022-09-30: 500 mg via ORAL
  Filled 2022-09-30: qty 1

## 2022-09-30 NOTE — ED Provider Notes (Signed)
   [facility] Provider Note    Event Date/Time   First MD Initiated Contact with Patient 09/30/22 2223     (approximate)  History   Chief Complaint: Sore Throat  HPI  Lauren Carrillo is a 32 y.o. female patient presents emergency department for sore throat over the past 2 days as well as a headache.  Vital signs are reassuring.  Patient denies any fever.  Physical Exam   Triage Vital Signs: ED Triage Vitals  Enc Vitals Group     BP 09/30/22 2147 119/78     Pulse Rate 09/30/22 2147 86     Resp 09/30/22 2147 18     Temp 09/30/22 2147 98.3 F (36.8 C)     Temp Source 09/30/22 2147 Oral     SpO2 09/30/22 2147 100 %     Weight 09/30/22 2145 120 lb (54.4 kg)     Height 09/30/22 2145 5\' 1"  (1.549 m)     Head Circumference --      Peak Flow --      Pain Score 09/30/22 2147 9     Pain Loc --      Pain Edu? --      Excl. in [place]? --     Most recent vital signs: Vitals:   09/30/22 2147  BP: 119/78  Pulse: 86  Resp: 18  Temp: 98.3 F (36.8 C)  SpO2: 100%    General: Awake, no distress.  CV:  Good peripheral perfusion.  Regular rate and rhythm  Resp:  Normal effort.  Equal breath no wheeze rales or rhonchi. Abd:  No distention.   Other:  Moderate pharyngeal erythema with left-sided tonsillar exudates.  Mild cervical lymphadenopathy.  No significant unilateral tonsillar hypertrophy.  No uvular deviation.   ED Results / Procedures / Treatments    MEDICATIONS ORDERED IN ED: Medications - No data to display   IMPRESSION / MDM / [place] / ED COURSE  I reviewed the triage vital signs and the nursing notes.  Patient's presentation is most consistent with acute presentation with potential threat to life or bodily function.  Patient presents emergency department for sore throat over the past 2 days as well as pain with swallowing.  Patient does have what appears consistent with tonsillitis with red tonsils with exudates present on the  tonsils.  Patient strep test is negative however given her obvious tonsillitis on physical exam we will place the patient on Keflex as a precaution.  Discussed supportive care at home including Tylenol/ibuprofen, over-the-counter lozenges or sprays.  Patient agreeable to plan of care.  FINAL CLINICAL IMPRESSION(S) / ED DIAGNOSES   Streptococcal pharyngitis  Rx / DC Orders   Keflex  Note:  This document was prepared using Dragon voice recognition software and may include unintentional dictation errors.   Harvest Dark, MD 09/30/22 2228

## 2022-09-30 NOTE — ED Triage Notes (Signed)
Pt presents ambulatory to triage via POV with complaints of sore throat for the last 2 days. She notes taking OTC motrin & Tylenol without improvement. She notes when she speaks the pain is more significant but her airway is patent. A&Ox4 at this time. Denies CP or SOB.

## 2022-10-22 ENCOUNTER — Emergency Department
Admission: EM | Admit: 2022-10-22 | Discharge: 2022-10-22 | Disposition: A | Discharge status: Home / Self Care | Payer: Medicaid Other | Attending: Emergency Medicine | Admitting: Emergency Medicine

## 2022-10-22 DIAGNOSIS — M62838 Other muscle spasm: Secondary | ICD-10-CM | POA: Diagnosis not present

## 2022-10-22 DIAGNOSIS — M542 Cervicalgia: Secondary | ICD-10-CM | POA: Diagnosis present

## 2022-10-22 MED ORDER — METHOCARBAMOL 500 MG PO TABS: 500.0000 mg | ORAL_TABLET | Freq: Four times a day (QID) | ORAL | 0 refills | Status: AC

## 2022-10-22 MED ORDER — MELOXICAM 15 MG PO TABS
15.0000 mg | ORAL_TABLET | Freq: Every day | ORAL | 0 refills | Status: AC
Start: 2022-10-22 — End: 2023-10-22

## 2022-10-22 NOTE — ED Triage Notes (Signed)
Pt sts that she has been having right neck and back pain x2 weeks.

## 2022-10-22 NOTE — ED Provider Notes (Signed)
[facility] Provider Note  Patient Contact: 4:30 PM (approximate)   History   Neck Injury   HPI  Lauren Carrillo is a 32 y.o. female  who presents to the ED with R neck/shoulder pain.  Patient states that there has been no trauma.  Initially she felt like she had pulled something, it was intermittent in nature and now becoming more constant.  No radiation down the arm.  She denies any pain currently in her neck it is more in her superior and posterior shoulder region.  No chest pain, shortness of breath.  No medications prior to arrival.     Physical Exam   Triage Vital Signs: ED Triage Vitals  Enc Vitals Group     BP 10/22/22 1549 105/61     Pulse Rate 10/22/22 1549 65     Resp 10/22/22 1549 18     Temp 10/22/22 1549 98.2 F (36.8 C)     Temp Source 10/22/22 1549 Oral     SpO2 10/22/22 1549 96 %     Weight 10/22/22 1547 130 lb (59 kg)     Height --      Head Circumference --      Peak Flow --      Pain Score 10/22/22 1546 7     Pain Loc --      Pain Edu? --      Excl. in [place]? --     Most recent vital signs: Vitals:   10/22/22 1549  BP: 105/61  Pulse: 65  Resp: 18  Temp: 98.2 F (36.8 C)  SpO2: 96%     General: Alert and in no acute distress. Head: No acute traumatic findings  Neck: No stridor. No cervical spine tenderness to palpation.  Palpation along the lateral neck reveals tenderness over the right trapezius muscle with palpable spasm.  No other tenderness.  Full range of motion to the cervical spine is preserved.  Pulses sensation intact and equal bilateral upper extremities.  Cardiovascular:  Good peripheral perfusion Respiratory: Normal respiratory effort without tachypnea or retractions. Lungs CTAB.  Musculoskeletal: Full range of motion to all extremities.  Neurologic:  No gross focal neurologic deficits are appreciated.  Skin:   No rash noted Other:   ED Results / Procedures / Treatments   Labs (all labs ordered  are listed, but only abnormal results are displayed) Labs Reviewed - No data to display   EKG     RADIOLOGY    No results found.  PROCEDURES:  Critical Care performed: No  Procedures   MEDICATIONS ORDERED IN ED: Medications - No data to display   IMPRESSION / MDM / ASSESSMENT AND PLAN / ED COURSE  I reviewed the triage vital signs and the nursing notes.                                 Differential diagnosis includes, but is not limited to, cervical spine strain, compression fracture, cervical radiculopathy, meningitis  Patient's presentation is most consistent with acute presentation with potential threat to life or bodily function.   Patient's diagnosis is consistent with trapezius muscle spasm.  Patient presents emergency department complaining of right neck/shoulder pain.  She has some tightness associated as well.  Palpation reveals tenderness over the trapezius muscle only.  Full range of motion to the cervical spine is preserved.  Pulses sensation intact.  No fevers or other meningeal signs.  Patient will be treated symptomatically with anti-inflammatory muscle relaxer.  Follow-up primary care as needed. Patient is given ED precautions to return to the ED for any worsening or new symptoms.     FINAL CLINICAL IMPRESSION(S) / ED DIAGNOSES   Final diagnoses:  Trapezius muscle spasm     Rx / DC Orders   ED Discharge Orders          Ordered    meloxicam (MOBIC) 15 MG tablet  Daily        10/22/22 1641    methocarbamol (ROBAXIN) 500 MG tablet  4 times daily        10/22/22 1641             Note:  This document was prepared using Dragon voice recognition software and may include unintentional dictation errors.   Lanette Hampshire 10/22/22 1642    Pilar Jarvis, MD 10/22/22 1739

## 2023-11-08 ENCOUNTER — Ambulatory Visit: Admission: EM | Admit: 2023-11-08 | Discharge: 2023-11-08 | Disposition: A | Discharge status: Home / Self Care

## 2023-11-08 DIAGNOSIS — Z113 Encounter for screening for infections with a predominantly sexual mode of transmission: Secondary | ICD-10-CM | POA: Diagnosis present

## 2023-11-08 LAB — POCT URINALYSIS DIP (MANUAL ENTRY)
Bilirubin, UA: NEGATIVE
Blood, UA: NEGATIVE
Glucose, UA: NEGATIVE mg/dL
Leukocytes, UA: NEGATIVE
Nitrite, UA: NEGATIVE
Protein Ur, POC: 30 mg/dL — AB
Spec Grav, UA: >=1.03 — AB (ref 1.010–1.025)
Urobilinogen, UA: 0.2 U/dL
pH, UA: 5.5 (ref 5.0–8.0)

## 2023-11-08 LAB — POCT URINE PREGNANCY: Preg Test, Ur: NEGATIVE

## 2023-11-08 NOTE — Discharge Instructions (Addendum)
  1. Screening for STD (sexually transmitted disease) (Primary) - Cervicovaginal swab collected in UC and sent to lab for further testing results should be available in 1 to 2 days via MyChart.  If there is any abnormality you will be contacted and appropriate guidance provided. - POCT urine pregnancy performed in UC is negative for hCG - POCT urinalysis dipstick shows no leukocytes, no nitrite, no blood no signs of urinary tract infection.

## 2023-11-08 NOTE — ED Triage Notes (Signed)
 Patient to Urgent Care for STD testing.  Denies any known exposure. Denies any symptoms.

## 2023-11-08 NOTE — ED Provider Notes (Signed)
 [facility]  Note:  This document was prepared using Conservation officer, historic buildings and may include unintentional dictation errors.  MRN: [id number] DOB: 1991/05/13  Subjective:   Lauren Carrillo is a 33 y.o. female presenting for STD screening.  Denies any known exposure, no current symptoms.  No dysuria, vaginal discharge, vaginal lesion, abdominal pain, flank pain.  No current facility-administered medications for this encounter.  Current Outpatient Medications:    cephALEXin  (KEFLEX ) 500 MG capsule, Take 1 capsule (500 mg total) by mouth 2 (two) times daily. (Patient not taking: Reported on 11/08/2023), Disp: 14 capsule, Rfl: 0   hydrOXYzine  (ATARAX /VISTARIL ) 25 MG tablet, Take 1 tablet (25 mg total) by mouth 3 (three) times daily as needed., Disp: 30 tablet, Rfl: 0   methocarbamol  (ROBAXIN ) 500 MG tablet, Take 1 tablet (500 mg total) by mouth 4 (four) times daily. (Patient not taking: Reported on 11/08/2023), Disp: 16 tablet, Rfl: 0   metroNIDAZOLE  (FLAGYL ) 500 MG tablet, Take 1 tablet (500 mg total) by mouth 2 (two) times daily. (Patient not taking: Reported on 11/08/2023), Disp: 14 tablet, Rfl: 0   ondansetron  (ZOFRAN  ODT) 4 MG disintegrating tablet, Take 1 tablet (4 mg total) by mouth every 8 (eight) hours as needed for nausea or vomiting. (Patient not taking: Reported on 11/08/2023), Disp: 20 tablet, Rfl: 0   oxyCODONE  (ROXICODONE ) 5 MG immediate release tablet, Take 1 tablet (5 mg total) by mouth every 8 (eight) hours as needed for up to 8 doses. (Patient not taking: Reported on 11/08/2023), Disp: 8 tablet, Rfl: 0   potassium chloride  SA (KLOR-CON ) 20 MEQ tablet, Take 1 tablet (20 mEq total) by mouth daily for 3 days. (Patient not taking: Reported on 11/08/2023), Disp: 3 tablet, Rfl: 0   predniSONE  (STERAPRED UNI-PAK 21 TAB) 10 MG (21) TBPK tablet, Take 6 pills on day one then decrease by 1 pill each day, Disp: 21 tablet, Rfl: 0   Prenatal Vit-Fe Fumarate-FA  (MULTIVITAMIN-PRENATAL) 27-0.8 MG TABS tablet, Take 1 tablet by mouth daily at 12 noon., Disp: , Rfl:    traMADol  (ULTRAM ) 50 MG tablet, Take 1 tablet (50 mg total) by mouth every 6 (six) hours as needed., Disp: 8 tablet, Rfl: 0   No Known Allergies  Past Medical History:  Diagnosis Date   History of hiatal hernia      Past Surgical History:  Procedure Laterality Date   NO PAST SURGERIES     WISDOM TOOTH EXTRACTION      Family History  Problem Relation Age of Onset   Cancer Father        lung    Social History   Tobacco Use   Smoking status: Every Day    Current packs/day: 1.00    Average packs/day: 1 pack/day for 4.0 years (4.0 ttl pk-yrs)    Types: Cigars, Cigarettes   Smokeless tobacco: Never  Substance Use Topics   Alcohol use: Not Currently    Comment: rare 1-2 times a year.   Drug use: Not Currently    Frequency: 7.0 times per week    Types: Marijuana    Comment: every day    ROS Refer to HPI for ROS details.  Objective:   Vitals: BP 124/70   Pulse 85   Temp 98 F (36.7 C)   Resp 18   LMP 10/23/2023   SpO2 98%   Physical Exam Vitals and nursing note reviewed.  Constitutional:      General: She is not in acute distress.  Appearance: She is well-developed. She is not ill-appearing or toxic-appearing.  HENT:     Head: Normocephalic and atraumatic.  Cardiovascular:     Rate and Rhythm: Normal rate.  Pulmonary:     Effort: Pulmonary effort is normal. No respiratory distress.  Abdominal:     General: There is no distension.     Palpations: Abdomen is soft.     Tenderness: There is no abdominal tenderness. There is no right CVA tenderness, left CVA tenderness, guarding or rebound.  Skin:    General: Skin is warm and dry.  Neurological:     General: No focal deficit present.     Mental Status: She is alert and oriented to person, place, and time.  Psychiatric:        Mood and Affect: Mood normal.        Behavior: Behavior normal.      Procedures  Results for orders placed or performed during the hospital encounter of 11/08/23 (from the past 24 hours)  POCT urine pregnancy     Status: None   Collection Time: 11/08/23  6:21 PM  Result Value Ref Range   Preg Test, Ur Negative Negative  POCT urinalysis dipstick     Status: Abnormal   Collection Time: 11/08/23  6:21 PM  Result Value Ref Range   Color, UA yellow yellow   Clarity, UA clear clear   Glucose, UA negative negative mg/dL   Bilirubin, UA negative negative   Ketones, POC UA trace (5) (A) negative mg/dL   Spec Grav, UA >=1.610 (A) 1.010 - 1.025   Blood, UA negative negative   pH, UA 5.5 5.0 - 8.0   Protein Ur, POC =30 (A) negative mg/dL   Urobilinogen, UA 0.2 0.2 or 1.0 E.U./dL   Nitrite, UA Negative Negative   Leukocytes, UA Negative Negative    No results found.   Assessment and Plan :     Discharge Instructions       1. Screening for STD (sexually transmitted disease) (Primary) - Cervicovaginal swab collected in UC and sent to lab for further testing results should be available in 1 to 2 days via MyChart.  If there is any abnormality you will be contacted and appropriate guidance provided. - POCT urine pregnancy performed in UC is negative for hCG - POCT urinalysis dipstick shows no leukocytes, no nitrite, no blood no signs of urinary tract infection.      Donal Lynam B Romel Dumond   Edmonia Gonser, [place], [place] 11/08/23 1825

## 2023-11-09 ENCOUNTER — Telehealth (HOSPITAL_COMMUNITY): Payer: Self-pay

## 2023-11-09 ENCOUNTER — Ambulatory Visit

## 2023-11-09 LAB — CERVICOVAGINAL ANCILLARY ONLY
Bacterial Vaginitis (gardnerella): POSITIVE — AB
Candida Glabrata: NEGATIVE
Candida Vaginitis: NEGATIVE
Chlamydia: NEGATIVE
Comment: NEGATIVE
Comment: NEGATIVE
Comment: NEGATIVE
Comment: NEGATIVE
Comment: NEGATIVE
Comment: NORMAL
Neisseria Gonorrhea: NEGATIVE
Trichomonas: POSITIVE — AB

## 2023-11-09 MED ORDER — METRONIDAZOLE 500 MG PO TABS: 500.0000 mg | ORAL_TABLET | Freq: Two times a day (BID) | ORAL | 0 refills | Status: AC

## 2023-11-09 NOTE — Telephone Encounter (Signed)
 Per protocol, pt requires tx with metronidazole. Reviewed with patient, verified pharmacy, prescription sent.

## 2023-12-08 ENCOUNTER — Other Ambulatory Visit: Payer: Self-pay

## 2023-12-08 ENCOUNTER — Emergency Department
Admission: EM | Admit: 2023-12-08 | Discharge: 2023-12-08 | Disposition: A | Discharge status: Home / Self Care | Attending: Emergency Medicine | Admitting: Emergency Medicine

## 2023-12-08 ENCOUNTER — Encounter: Payer: Self-pay | Admitting: Emergency Medicine

## 2023-12-08 DIAGNOSIS — R059 Cough, unspecified: Secondary | ICD-10-CM | POA: Diagnosis not present

## 2023-12-08 DIAGNOSIS — H6122 Impacted cerumen, left ear: Secondary | ICD-10-CM | POA: Insufficient documentation

## 2023-12-08 DIAGNOSIS — H9202 Otalgia, left ear: Secondary | ICD-10-CM | POA: Diagnosis present

## 2023-12-08 NOTE — ED Provider Notes (Signed)
 [facility] Provider Note    Event Date/Time   First MD Initiated Contact with Patient 12/08/23 [phone]     (approximate)   History   Otalgia   HPI  Lauren Carrillo is a 33 y.o. female   Past medical history of no significant past medical history presents emerged part with left ear fullness and pain and difficulty hearing out of that side.  She has some mild nasal congestion as well but no fever no chest pain no shortness of breath.  Mild cough.  No other acute medical complaints.       Physical Exam   Triage Vital Signs: ED Triage Vitals  Encounter Vitals Group     BP 12/08/23 0318 114/65     Systolic BP Percentile --      Diastolic BP Percentile --      Pulse Rate 12/08/23 0318 84     Resp 12/08/23 0318 17     Temp 12/08/23 0318 98.1 F (36.7 C)     Temp Source 12/08/23 0318 Oral     SpO2 12/08/23 0318 97 %     Weight 12/08/23 0317 135 lb (61.2 kg)     Height 12/08/23 0317 5\' 1"  (1.549 m)     Head Circumference --      Peak Flow --      Pain Score 12/08/23 0316 2     Pain Loc --      Pain Education --      Exclude from Growth Chart --     Most recent vital signs: Vitals:   12/08/23 0318  BP: 114/65  Pulse: 84  Resp: 17  Temp: 98.1 F (36.7 C)  SpO2: 97%    General: Awake, no distress.  CV:  Good peripheral perfusion.  Resp:  Normal effort.  Abd:  No distention.  Other:  She has cerumen impaction to the left ear after cleared TM appears normal.  No evidence of mastoiditis.  Nasal congestion.  Clear lungs.   ED Results / Procedures / Treatments   Labs (all labs ordered are listed, but only abnormal results are displayed) Labs Reviewed - No data to display   PROCEDURES:  Critical Care performed: No  Ear Cerumen Removal  Date/Time: 12/08/2023 4:39 AM  Performed by: Buell Carmin, MD Authorized by: Buell Carmin, MD   Consent:    Consent obtained:  Verbal   Consent given by:  Patient   Risks, benefits, and  alternatives were discussed: yes     Risks discussed:  Incomplete removal, TM perforation and pain   Alternatives discussed:  No treatment Universal protocol:    Procedure explained and questions answered to patient or proxy's satisfaction: yes     Patient identity confirmed:  Verbally with patient Procedure details:    Location:  L ear   Procedure type: irrigation     Procedure outcomes: cerumen removed   Post-procedure details:    Inspection:  Ear canal clear   Hearing quality:  Improved   Procedure completion:  Tolerated    MEDICATIONS ORDERED IN ED: Medications - No data to display  IMPRESSION / MDM / ASSESSMENT AND PLAN / ED COURSE  I reviewed the triage vital signs and the nursing notes.                                Patient's presentation is most consistent with acute presentation with potential threat to  life or bodily function.  Differential diagnosis includes, but is not limited to, ear infection, sinusitis, cerumen infection, considered but less likely sepsis, mastoiditis    MDM:     Well-appearing patient with cerumen impaction removed as above and tolerated well and TM does not appear infected.  She feels much better after cerumen was removed.  She has some nasal congestion for which I recommended sinus rinses.  She does not appear septic, feels much better, and will be discharged.       FINAL CLINICAL IMPRESSION(S) / ED DIAGNOSES   Final diagnoses:  Impacted cerumen of left ear     Rx / DC Orders   ED Discharge Orders     None        Note:  This document was prepared using Dragon voice recognition software and may include unintentional dictation errors.    Buell Carmin, MD 12/08/23 [phone]

## 2023-12-08 NOTE — Discharge Instructions (Signed)
 In terms of your nasal congestion you can nasal irrigation kits you can find at your local pharmacy.  If you do have seasonal allergies, intranasal steroids like fluticasone  worked best.  Lauren Carrillo can find this at your local pharmacy as well.  Thank you for choosing us  for your health care today!  Please see your primary doctor this week for a follow up appointment.   If you have any new, worsening, or unexpected symptoms call your doctor right away or come back to the emergency department for reevaluation.  It was my pleasure to care for you today.   Lauren Carrillo Margery Sheets, MD

## 2023-12-08 NOTE — ED Triage Notes (Signed)
 Patient ambulatory to triage with steady gait, without difficulty or distress noted; pt reports left ear stopped up with some discomfort and nasal drainage
